# Patient Record
Sex: Female | Born: 1995 | Race: White | Hispanic: Yes | Marital: Married | Smoking: Never smoker
Health system: Southern US, Community
[De-identification: ages and names within clinical notes are randomized; demographics above are authoritative.]

## PROBLEM LIST (undated history)

## (undated) DIAGNOSIS — Z789 Other specified health status: Secondary | ICD-10-CM

## (undated) DIAGNOSIS — K219 Gastro-esophageal reflux disease without esophagitis: Secondary | ICD-10-CM

## (undated) HISTORY — DX: Other specified health status: Z78.9

## (undated) HISTORY — PX: NO PAST SURGERIES: SHX2092

## (undated) HISTORY — PX: WISDOM TOOTH EXTRACTION: SHX21

## (undated) HISTORY — DX: Gastro-esophageal reflux disease without esophagitis: K21.9

---

## 2021-06-08 ENCOUNTER — Ambulatory Visit (INDEPENDENT_AMBULATORY_CARE_PROVIDER_SITE_OTHER): Payer: Medicaid Other

## 2021-06-08 ENCOUNTER — Other Ambulatory Visit (HOSPITAL_COMMUNITY)
Admission: RE | Admit: 2021-06-08 | Discharge: 2021-06-08 | Disposition: A | Discharge status: Home / Self Care | Payer: Medicaid Other | Source: Ambulatory Visit | Attending: Family Medicine | Admitting: Family Medicine

## 2021-06-08 VITALS — BP 113/62 | HR 60 | Wt 140.5 lb

## 2021-06-08 DIAGNOSIS — Z3401 Encounter for supervision of normal first pregnancy, first trimester: Secondary | ICD-10-CM | POA: Diagnosis present

## 2021-06-08 DIAGNOSIS — Z3400 Encounter for supervision of normal first pregnancy, unspecified trimester: Secondary | ICD-10-CM | POA: Insufficient documentation

## 2021-06-08 MED ORDER — GOJJI WEIGHT SCALE MISC: 1.0000 | 0 refills | Status: DC | PRN

## 2021-06-08 MED ORDER — BLOOD PRESSURE KIT DEVI: 1.0000 | 0 refills | Status: DC | PRN

## 2021-06-08 NOTE — Progress Notes (Signed)
New OB Intake  Patient came in person for her New OB intakes. Eda, spanish interpreter with [facility] assist with the visit.   I discussed the limitations, risks, security and privacy concerns of performing an evaluation and management service by telephone and the availability of in person appointments. I also discussed with the patient that there may be a patient responsible charge related to this service. The patient expressed understanding and agreed to proceed.  I explained I am completing New OB Intake today. We discussed her EDD of 11/28/2021 that is based on LMP of 01/30. Pt is G1/P0. I reviewed her allergies, medications, Medical/Surgical/OB history, and appropriate screenings. I informed her of [facility] services. Based on history, this is a/an  pregnancy uncomplicated .   Patient Active Problem List   Diagnosis Date Noted   Supervision of low-risk first pregnancy 06/08/2021    Delivery Plans:  Plans to deliver at [facility].   MyChart/Babyscripts MyChart access verified. I explained pt will have some visits in office and some virtually. Babyscripts instructions given and order placed. Patient verifies receipt of registration text/e-mail. Account successfully created and app downloaded.  Blood Pressure Cuff  Blood pressure cuff ordered for patient to pick-up from [employer]. Explained after first prenatal appt pt will check weekly and document in Babyscripts.  Weight scale: Patient does not  have weight scale. Weight scale ordered for patient to pick up from [employer].   Anatomy US Explained first scheduled [place] will be around 19 weeks. Anatomy US scheduled for 07/06/2021  at 1:30PM. Pt notified to arrive at 1:15pm.   Labs -Initial OB labs, genetic screening, AFP, OB culture, GC/CH, A1C labs were drawn.   Covid Vaccine Patient has not covid vaccine.   Is patient a CenteringPregnancy candidate?  Declined Declined due to Support Person Concern   Is patient a Mom+Baby  Combined Care candidate?  Declined    Is patient interested in [place]?  No   Informed patient of [facility] Healthy [facility] website  and placed link in her AVS.   Social Determinants of Health Food Insecurity: Patient denies food insecurity. WIC Referral: Patient is interested in [facility] to [facility].  Transportation: Patient denies transportation needs. Childcare: Discussed no children allowed at ultrasound appointments. Offered childcare services; patient declines childcare services at this time.  Send link to Pregnancy Navigators   Placed OB Box on problem list and updated  First visit review I reviewed new OB appt with pt. I explained she will have a pelvic exam and PAP smear. Explained pt will be seen by Warner Mccreedy MD at first visit; encounter routed to appropriate provider. Explained that patient will be seen by pregnancy navigator following visit with provider. [facility] information placed in AVS.   Vidal Schwalbe, CMA 06/08/2021  1:55 PM

## 2021-06-09 ENCOUNTER — Other Ambulatory Visit: Payer: Self-pay | Admitting: Family Medicine

## 2021-06-09 LAB — GC/CHLAMYDIA PROBE AMP (~~LOC~~) NOT AT ARMC
Chlamydia: POSITIVE — AB
Comment: NEGATIVE
Comment: NORMAL
Neisseria Gonorrhea: NEGATIVE

## 2021-06-09 MED ORDER — AZITHROMYCIN 500 MG PO TABS: 1000.0000 mg | ORAL_TABLET | Freq: Once | ORAL | 0 refills | Status: AC

## 2021-06-10 ENCOUNTER — Telehealth: Payer: Self-pay | Admitting: *Deleted

## 2021-06-10 ENCOUNTER — Encounter: Payer: Self-pay | Admitting: *Deleted

## 2021-06-10 LAB — URINE CULTURE, OB REFLEX: Organism ID, Bacteria: NO GROWTH

## 2021-06-10 LAB — CULTURE, OB URINE

## 2021-06-10 NOTE — Telephone Encounter (Signed)
I called Roberta Hodges with Interpreter Lorinda Creed and notified her of + Chlamydia and RX sent to [facility] for [employer]. Also notified her partner should get treated by his PCP or [facility]. Also advised no sexual intercourse/ contact for 2 weeks after both treated. Also will be retested in a few weeks. She voices understanding. STD report to [facility] completed and faxed. Nancy Fetter

## 2021-06-10 NOTE — Telephone Encounter (Signed)
-----   Message from Warner Mccreedy, MD sent at 06/09/2021  9:57 PM EDT ----- Regarding: Positive chlamydia, request to reach out to patient Hi This patient's swab came back positive for chlamydia. Could someone reach out to her to let her know and discuss partner testing and treatment as well?  I sent her treatment already to her pharmacy ([facility])  Thanks Dr. Ephriam Jenkins

## 2021-06-11 LAB — CBC/D/PLT+RPR+RH+ABO+RUBIGG...
Antibody Screen: NEGATIVE
Basophils Absolute: 0 10*3/uL (ref 0.0–0.2)
Basos: 0 %
EOS (ABSOLUTE): 0 10*3/uL (ref 0.0–0.4)
Eos: 0 %
HCV Ab: REACTIVE — AB
HIV Screen 4th Generation wRfx: NONREACTIVE
Hematocrit: 35.1 % (ref 34.0–46.6)
Hemoglobin: 12 g/dL (ref 11.1–15.9)
Hepatitis B Surface Ag: NEGATIVE
Immature Grans (Abs): 0 10*3/uL (ref 0.0–0.1)
Immature Granulocytes: 1 %
Lymphocytes Absolute: 1.6 10*3/uL (ref 0.7–3.1)
Lymphs: 19 %
MCH: 30.3 pg (ref 26.6–33.0)
MCHC: 34.2 g/dL (ref 31.5–35.7)
MCV: 89 fL (ref 79–97)
Monocytes Absolute: 0.6 10*3/uL (ref 0.1–0.9)
Monocytes: 7 %
Neutrophils Absolute: 6.2 10*3/uL (ref 1.4–7.0)
Neutrophils: 73 %
Platelets: 284 10*3/uL (ref 150–450)
RBC: 3.96 x10E6/uL (ref 3.77–5.28)
RDW: 12.9 % (ref 11.7–15.4)
RPR Ser Ql: NONREACTIVE
Rh Factor: POSITIVE
Rubella Antibodies, IGG: 3.69 index (ref >0.99)
WBC: 8.5 10*3/uL (ref 3.4–10.8)

## 2021-06-11 LAB — AFP, SERUM, OPEN SPINA BIFIDA
AFP MoM: 0.75
AFP Value: 24.7 ng/mL
Gest. Age on Collection Date: 15.3 weeks
Maternal Age At EDD: 27.4 yr
OSBR Risk 1 IN: 10000
Test Results:: NEGATIVE
Weight: 140 [lb_av]

## 2021-06-11 LAB — HCV RT-PCR, QUANT (NON-GRAPH): Hepatitis C Quantitation: NOT DETECTED IU/mL

## 2021-06-11 LAB — HEMOGLOBIN A1C
Est. average glucose Bld gHb Est-mCnc: 103 mg/dL
Hgb A1c MFr Bld: 5.2 % (ref 4.8–5.6)

## 2021-06-16 ENCOUNTER — Telehealth: Payer: Self-pay

## 2021-06-16 ENCOUNTER — Other Ambulatory Visit: Payer: Self-pay | Admitting: Family Medicine

## 2021-06-16 DIAGNOSIS — R768 Other specified abnormal immunological findings in serum: Secondary | ICD-10-CM

## 2021-06-16 DIAGNOSIS — Z3401 Encounter for supervision of normal first pregnancy, first trimester: Secondary | ICD-10-CM

## 2021-06-16 NOTE — Progress Notes (Signed)
Initial OB labs positive Hep C ab.. Plan for LFTs and hep C Quant testing and will further discuss diagnosis and post partum treatment at initial OB visit in 06/2021  Message sent to clinical pool to inform patient of results and plan.  Warner Mccreedy, MD, MPH OB Fellow, Faculty Practice

## 2021-06-16 NOTE — Addendum Note (Signed)
Addended by: Faythe Casa on: 06/16/2021 10:02 PM   Modules accepted: Orders

## 2021-06-16 NOTE — Telephone Encounter (Signed)
Call placed to pt with interpreter Eda. Spoke with pt. Pt given results and recommendations per Dr Ephriam Jenkins. Pt verbalized understanding. Pt scheduled for lab draw on 5/26 at 1030am. Pt agreeable to date and time of appt.  Roberta Hodges

## 2021-06-16 NOTE — Telephone Encounter (Signed)
-----   Message from Warner Mccreedy, MD sent at 06/16/2021  9:46 AM EDT ----- Regarding: Request to inform patient of hepatitis C testing results and plan Hi This patient's initial OB labs was positive for Hepatitis C. Could you call and inform patient of this (she is Spanish speaking per her chart). She should come in and get some additional labs done which I have ordered (these will check her liver function and also the amount of virus in her body). This will allow [place] to better decide on next steps although in general people don't get treated for hepatitis C in pregnancy but can be treated after.  I can discuss more with her when she comes for her new OB appt with me in June. If she can get her labs done before that it would be helpful so I can discuss those results with her too.  Thanks Dr. Ephriam Jenkins

## 2021-06-17 ENCOUNTER — Other Ambulatory Visit: Payer: Medicaid Other

## 2021-06-17 DIAGNOSIS — Z3402 Encounter for supervision of normal first pregnancy, second trimester: Secondary | ICD-10-CM

## 2021-06-18 LAB — COMPREHENSIVE METABOLIC PANEL
ALT: 9 IU/L (ref 0–32)
AST: 16 IU/L (ref 0–40)
Albumin/Globulin Ratio: 1.4 (ref 1.2–2.2)
Albumin: 4.2 g/dL (ref 3.9–5.0)
Alkaline Phosphatase: 51 IU/L (ref 44–121)
BUN/Creatinine Ratio: 12 (ref 9–23)
BUN: 6 mg/dL (ref 6–20)
Bilirubin Total: 0.2 mg/dL (ref 0.0–1.2)
CO2: 21 mmol/L (ref 20–29)
Calcium: 9.4 mg/dL (ref 8.7–10.2)
Chloride: 100 mmol/L (ref 96–106)
Creatinine, Ser: 0.51 mg/dL — ABNORMAL LOW (ref 0.57–1.00)
Globulin, Total: 2.9 g/dL (ref 1.5–4.5)
Glucose: 80 mg/dL (ref 70–99)
Potassium: 4.4 mmol/L (ref 3.5–5.2)
Sodium: 136 mmol/L (ref 134–144)
Total Protein: 7.1 g/dL (ref 6.0–8.5)
eGFR: 132 mL/min/{1.73_m2} (ref >59)

## 2021-06-23 ENCOUNTER — Encounter: Payer: Self-pay | Admitting: *Deleted

## 2021-07-06 ENCOUNTER — Ambulatory Visit: Payer: Medicaid Other | Admitting: *Deleted

## 2021-07-06 ENCOUNTER — Ambulatory Visit: Payer: Medicaid Other | Attending: Family Medicine

## 2021-07-06 ENCOUNTER — Other Ambulatory Visit: Payer: Self-pay | Admitting: *Deleted

## 2021-07-06 VITALS — BP 117/63 | HR 93 | Ht 59.06 in

## 2021-07-06 DIAGNOSIS — Z3402 Encounter for supervision of normal first pregnancy, second trimester: Secondary | ICD-10-CM

## 2021-07-06 DIAGNOSIS — Z363 Encounter for antenatal screening for malformations: Secondary | ICD-10-CM | POA: Diagnosis not present

## 2021-07-06 DIAGNOSIS — O98512 Other viral diseases complicating pregnancy, second trimester: Secondary | ICD-10-CM | POA: Insufficient documentation

## 2021-07-06 DIAGNOSIS — Z362 Encounter for other antenatal screening follow-up: Secondary | ICD-10-CM

## 2021-07-06 DIAGNOSIS — B1920 Unspecified viral hepatitis C without hepatic coma: Secondary | ICD-10-CM

## 2021-07-06 DIAGNOSIS — Z3401 Encounter for supervision of normal first pregnancy, first trimester: Secondary | ICD-10-CM | POA: Insufficient documentation

## 2021-07-06 DIAGNOSIS — Z3A19 19 weeks gestation of pregnancy: Secondary | ICD-10-CM | POA: Diagnosis not present

## 2021-07-14 ENCOUNTER — Other Ambulatory Visit (HOSPITAL_COMMUNITY)
Admission: RE | Admit: 2021-07-14 | Discharge: 2021-07-14 | Disposition: A | Discharge status: Home / Self Care | Payer: Medicaid Other | Source: Ambulatory Visit | Attending: Family Medicine | Admitting: Family Medicine

## 2021-07-14 ENCOUNTER — Other Ambulatory Visit: Payer: Self-pay

## 2021-07-14 ENCOUNTER — Ambulatory Visit (INDEPENDENT_AMBULATORY_CARE_PROVIDER_SITE_OTHER): Payer: Medicaid Other | Admitting: Family Medicine

## 2021-07-14 ENCOUNTER — Encounter: Payer: Self-pay | Admitting: Family Medicine

## 2021-07-14 VITALS — BP 114/69 | HR 93 | Wt 151.0 lb

## 2021-07-14 DIAGNOSIS — Z8619 Personal history of other infectious and parasitic diseases: Secondary | ICD-10-CM | POA: Diagnosis not present

## 2021-07-14 DIAGNOSIS — Z3A20 20 weeks gestation of pregnancy: Secondary | ICD-10-CM

## 2021-07-14 DIAGNOSIS — Z3402 Encounter for supervision of normal first pregnancy, second trimester: Secondary | ICD-10-CM

## 2021-07-14 DIAGNOSIS — R768 Other specified abnormal immunological findings in serum: Secondary | ICD-10-CM | POA: Insufficient documentation

## 2021-07-15 LAB — CERVICOVAGINAL ANCILLARY ONLY
Chlamydia: NEGATIVE
Comment: NEGATIVE
Comment: NEGATIVE
Comment: NORMAL
Neisseria Gonorrhea: NEGATIVE
Trichomonas: NEGATIVE

## 2021-07-15 LAB — CYTOLOGY - PAP
Comment: NEGATIVE
Diagnosis: NEGATIVE
High risk HPV: NEGATIVE

## 2021-08-05 ENCOUNTER — Ambulatory Visit: Payer: Medicaid Other | Admitting: *Deleted

## 2021-08-05 ENCOUNTER — Ambulatory Visit: Payer: Medicaid Other | Attending: Maternal & Fetal Medicine

## 2021-08-05 VITALS — BP 100/60 | HR 89

## 2021-08-05 DIAGNOSIS — O98412 Viral hepatitis complicating pregnancy, second trimester: Secondary | ICD-10-CM | POA: Insufficient documentation

## 2021-08-05 DIAGNOSIS — Z363 Encounter for antenatal screening for malformations: Secondary | ICD-10-CM | POA: Diagnosis present

## 2021-08-05 DIAGNOSIS — Z3A23 23 weeks gestation of pregnancy: Secondary | ICD-10-CM | POA: Diagnosis not present

## 2021-08-05 DIAGNOSIS — O358XX0 Maternal care for other (suspected) fetal abnormality and damage, not applicable or unspecified: Secondary | ICD-10-CM

## 2021-08-05 DIAGNOSIS — B182 Chronic viral hepatitis C: Secondary | ICD-10-CM | POA: Diagnosis not present

## 2021-08-05 DIAGNOSIS — Z362 Encounter for other antenatal screening follow-up: Secondary | ICD-10-CM

## 2021-08-05 DIAGNOSIS — B1920 Unspecified viral hepatitis C without hepatic coma: Secondary | ICD-10-CM

## 2021-08-05 DIAGNOSIS — Z3402 Encounter for supervision of normal first pregnancy, second trimester: Secondary | ICD-10-CM

## 2021-08-11 ENCOUNTER — Encounter: Payer: Self-pay | Admitting: Obstetrics and Gynecology

## 2021-08-11 ENCOUNTER — Other Ambulatory Visit: Payer: Self-pay

## 2021-08-11 ENCOUNTER — Ambulatory Visit (INDEPENDENT_AMBULATORY_CARE_PROVIDER_SITE_OTHER): Payer: Medicaid Other | Admitting: Obstetrics and Gynecology

## 2021-08-11 VITALS — BP 111/64 | HR 92 | Wt 155.9 lb

## 2021-08-11 DIAGNOSIS — Z3A24 24 weeks gestation of pregnancy: Secondary | ICD-10-CM

## 2021-08-11 DIAGNOSIS — Z3402 Encounter for supervision of normal first pregnancy, second trimester: Secondary | ICD-10-CM

## 2021-08-11 DIAGNOSIS — O99619 Diseases of the digestive system complicating pregnancy, unspecified trimester: Secondary | ICD-10-CM

## 2021-08-11 DIAGNOSIS — K219 Gastro-esophageal reflux disease without esophagitis: Secondary | ICD-10-CM

## 2021-08-11 DIAGNOSIS — R768 Other specified abnormal immunological findings in serum: Secondary | ICD-10-CM

## 2021-08-11 MED ORDER — FAMOTIDINE 20 MG PO TABS: 20.0000 mg | ORAL_TABLET | Freq: Every evening | ORAL | 0 refills | Status: DC

## 2021-08-11 NOTE — Progress Notes (Signed)
   PRENATAL VISIT NOTE  Subjective:  Roberta Hodges is a 26 y.o. G1P0 at [redacted]w[redacted]d being seen today for ongoing prenatal care.  She is currently monitored for the following issues for this low-risk pregnancy and has Supervision of low-risk first pregnancy and Hepatitis C antibody test positive on their problem list.  Patient reports heartburn.  Contractions: Not present. Vag. Bleeding: None.  Movement: Present. Starting to feel quickening. Denies leaking of fluid.   The following portions of the patient's history were reviewed and updated as appropriate: allergies, current medications, past family history, past medical history, past social history, past surgical history and problem list.   Objective:   Vitals:   08/11/21 1119  BP: 111/64  Pulse: 92  Weight: 155 lb 14.4 oz (70.7 kg)    Fetal Status: Fetal Heart Rate (bpm): 151 Fundal Height: 22 cm Movement: Present     General:  Alert, oriented and cooperative. Patient is in no acute distress.  Skin: Skin is warm and dry. No rash noted.   Cardiovascular: Normal heart rate noted  Respiratory: Normal respiratory effort, no problems with respiration noted  Abdomen: Soft, gravid, appropriate for gestational age.  Pain/Pressure: Absent     Pelvic: Cervical exam deferred        Extremities: Normal range of motion.  Edema: None  Mental Status: Normal mood and affect. Normal behavior. Normal judgment and thought content.   Assessment and Plan:  Pregnancy: G1P0 at [redacted]w[redacted]d 1. [redacted] weeks gestation of pregnancy 2. Encounter for supervision of low-risk first pregnancy in second trimester Doing well, no concerns. Reviewed anatomy scan. Shows normal growth and development. - Scheduled for 2hr GTT next visit.  3. Hepatitis C antibody test positive Postive HCV Ab, with negative titers, indicating likely previous infection was noted on initial prenatal labs. Plan to recheck HCV Ab with 28 week labs. - Hepatitis C Antibody; Future  4.  Gastroesophageal reflux in pregnancy - famotidine (PEPCID) 20 MG tablet; Take 1 tablet (20 mg total) by mouth at bedtime.  Dispense: 60 tablet; Refill: 0  Preterm labor symptoms and general obstetric precautions including but not limited to vaginal bleeding, contractions, leaking of fluid and fetal movement were reviewed in detail with the patient. Please refer to After Visit Summary for other counseling recommendations.   Return in about 4 weeks (around 09/08/2021) for 2 hour GTT and third trimester labs, lob.  Future Appointments  Date Time Provider Department Center  09/08/2021  8:35 AM [facility]  09/08/2021  9:30 AM [facility]  09/22/2021 10:55 AM Marylene Land, CNM [facility]  10/06/2021 10:15 AM Marylene Land, CNM [facility]  10/20/2021 10:15 AM Hermina Staggers, MD [facility]    [place] Benita Gutter, MD MPH OB Fellow, Faculty Practice.  Patient seen with Dr Donavan Foil.

## 2021-09-07 ENCOUNTER — Other Ambulatory Visit: Payer: Self-pay

## 2021-09-07 DIAGNOSIS — Z3403 Encounter for supervision of normal first pregnancy, third trimester: Secondary | ICD-10-CM

## 2021-09-08 ENCOUNTER — Other Ambulatory Visit: Payer: Self-pay

## 2021-09-08 ENCOUNTER — Ambulatory Visit (INDEPENDENT_AMBULATORY_CARE_PROVIDER_SITE_OTHER): Payer: Medicaid Other

## 2021-09-08 ENCOUNTER — Other Ambulatory Visit: Payer: Medicaid Other

## 2021-09-08 VITALS — BP 113/68 | HR 81 | Wt 165.0 lb

## 2021-09-08 DIAGNOSIS — Z23 Encounter for immunization: Secondary | ICD-10-CM

## 2021-09-08 DIAGNOSIS — R768 Other specified abnormal immunological findings in serum: Secondary | ICD-10-CM

## 2021-09-08 DIAGNOSIS — Z3A28 28 weeks gestation of pregnancy: Secondary | ICD-10-CM

## 2021-09-08 DIAGNOSIS — Z3403 Encounter for supervision of normal first pregnancy, third trimester: Secondary | ICD-10-CM

## 2021-09-08 NOTE — Progress Notes (Signed)
Patient complains of lower back along with a "pulluing" sensation on mid abdomen. Patient would like to discuss possible Hep C Dx

## 2021-09-08 NOTE — Progress Notes (Signed)
   PRENATAL VISIT NOTE  Subjective:  Roberta Hodges is a 26 y.o. G1P0 at [redacted]w[redacted]d being seen today for ongoing prenatal care.  She is currently monitored for the following issues for this low-risk pregnancy and has Supervision of low-risk first pregnancy and Hepatitis C antibody test positive on their problem list.  Patient reports no complaints.  Contractions: Not present. Vag. Bleeding: None.  Movement: Present. Denies leaking of fluid.   The following portions of the patient's history were reviewed and updated as appropriate: allergies, current medications, past family history, past medical history, past social history, past surgical history and problem list.   Objective:   Vitals:   09/08/21 0847  BP: 113/68  Pulse: 81  Weight: 165 lb (74.8 kg)    Fetal Status: Fetal Heart Rate (bpm): 150 Fundal Height: 27 cm Movement: Present     General:  Alert, oriented and cooperative. Patient is in no acute distress.  Skin: Skin is warm and dry. No rash noted.   Cardiovascular: Normal heart rate noted  Respiratory: Normal respiratory effort, no problems with respiration noted  Abdomen: Soft, gravid, appropriate for gestational age.  Pain/Pressure: Present     Pelvic: Cervical exam deferred        Extremities: Normal range of motion.     Mental Status: Normal mood and affect. Normal behavior. Normal judgment and thought content.   Assessment and Plan:  Pregnancy: G1P0 at [redacted]w[redacted]d 1. Encounter for supervision of low-risk first pregnancy in third trimester - Routine OB. Doing well, no concerns - GTT and labs today - Anticipatory guidance for upcoming appointments provided - Spanish interpretor used for entirety of today's visit  - Tdap vaccine greater than or equal to 7yo IM  2. [redacted] weeks gestation of pregnancy - FH appropriate - Endorses active fetal movement  3. Hepatitis C antibody test positive - Repeat antibody today   Preterm labor symptoms and general obstetric precautions  including but not limited to vaginal bleeding, contractions, leaking of fluid and fetal movement were reviewed in detail with the patient. Please refer to After Visit Summary for other counseling recommendations.   Return in about 2 weeks (around 09/22/2021).  Future Appointments  Date Time Provider Department Center  09/22/2021 10:55 AM Marylene Land, CNM [facility]  10/06/2021 10:15 AM Marylene Land, CNM [facility]  10/20/2021 10:15 AM Hermina Staggers, MD [facility]    Brand Males, CNM

## 2021-09-09 LAB — RPR: RPR Ser Ql: NONREACTIVE

## 2021-09-09 LAB — CBC
Hematocrit: 31.2 % — ABNORMAL LOW (ref 34.0–46.6)
Hemoglobin: 10.6 g/dL — ABNORMAL LOW (ref 11.1–15.9)
MCH: 30.5 pg (ref 26.6–33.0)
MCHC: 34 g/dL (ref 31.5–35.7)
MCV: 90 fL (ref 79–97)
Platelets: 272 10*3/uL (ref 150–450)
RBC: 3.47 x10E6/uL — ABNORMAL LOW (ref 3.77–5.28)
RDW: 12.2 % (ref 11.7–15.4)
WBC: 9.2 10*3/uL (ref 3.4–10.8)

## 2021-09-09 LAB — GLUCOSE TOLERANCE, 2 HOURS W/ 1HR
Glucose, 1 hour: 85 mg/dL (ref 70–179)
Glucose, 2 hour: 64 mg/dL — ABNORMAL LOW (ref 70–152)
Glucose, Fasting: 79 mg/dL (ref 70–91)

## 2021-09-09 LAB — HIV ANTIBODY (ROUTINE TESTING W REFLEX): HIV Screen 4th Generation wRfx: NONREACTIVE

## 2021-09-09 LAB — HEPATITIS C ANTIBODY: Hep C Virus Ab: NONREACTIVE

## 2021-09-21 NOTE — Progress Notes (Unsigned)
   PRENATAL VISIT NOTE  Subjective:  Roberta Hodges is a 26 y.o. G1P0 at [redacted]w[redacted]d being seen today for ongoing prenatal care.  She is currently monitored for the following issues for this {Blank single:19197::"high-risk","low-risk"} pregnancy and has Supervision of low-risk first pregnancy and Hepatitis C antibody test positive on their problem list.  Patient reports {sx:14538}.   .  .   . Denies leaking of fluid.   The following portions of the patient's history were reviewed and updated as appropriate: allergies, current medications, past family history, past medical history, past social history, past surgical history and problem list.   Objective:  There were no vitals filed for this visit.  Fetal Status:           General:  Alert, oriented and cooperative. Patient is in no acute distress.  Skin: Skin is warm and dry. No rash noted.   Cardiovascular: Normal heart rate noted  Respiratory: Normal respiratory effort, no problems with respiration noted  Abdomen: Soft, gravid, appropriate for gestational age.        Pelvic: {Blank single:19197::"Cervical exam performed in the presence of a chaperone","Cervical exam deferred"}        Extremities: Normal range of motion.     Mental Status: Normal mood and affect. Normal behavior. Normal judgment and thought content.   Assessment and Plan:  Pregnancy: G1P0 at [redacted]w[redacted]d There are no diagnoses linked to this encounter. {Blank single:19197::"Term","Preterm"} labor symptoms and general obstetric precautions including but not limited to vaginal bleeding, contractions, leaking of fluid and fetal movement were reviewed in detail with the patient. Please refer to After Visit Summary for other counseling recommendations.   No follow-ups on file.  Future Appointments  Date Time Provider Department Center  09/22/2021 10:55 AM Marylene Land, CNM [facility]  10/06/2021 10:15 AM Marylene Land, CNM [facility]  10/20/2021  10:15 AM Hermina Staggers, MD [facility]    Marylene Land, CNM

## 2021-09-22 ENCOUNTER — Ambulatory Visit (INDEPENDENT_AMBULATORY_CARE_PROVIDER_SITE_OTHER): Payer: Medicaid Other | Admitting: Student

## 2021-09-22 ENCOUNTER — Other Ambulatory Visit: Payer: Self-pay

## 2021-09-22 VITALS — BP 115/76 | HR 92 | Wt 169.4 lb

## 2021-09-22 DIAGNOSIS — Z3A30 30 weeks gestation of pregnancy: Secondary | ICD-10-CM

## 2021-09-22 DIAGNOSIS — Z3403 Encounter for supervision of normal first pregnancy, third trimester: Secondary | ICD-10-CM

## 2021-09-22 NOTE — Patient Instructions (Addendum)
AREA PEDIATRIC/FAMILY PRACTICE PHYSICIANS  [place] ([id number]) [facility] Deirdre Priest, MD; Lum Babe, MD; Sheffield Slider, MD; Leveda Anna, MD; McDiarmid, MD; Jerene Bears, MD; Jennette Kettle, MD; Gwendolyn Grant, MD [address] [place] [place], [place] [address] [phone] Mon-Fri 8:30-12:30, 1:30-5:00 Providers come to see babies at [facility] Accepting [facility] at [facility] providers who accept newborns: Docia Chuck, MD; Kateri Plummer, MD; Paulino Rily, MD [address], [place], [place] [address] [phone] Mon-Fri 8:00-5:30 Babies seen by providers at [facility] Does NOT accept Medicaid Please call early in hospitalization for appointment (limited availability)  [facility], MD [address] [place], [place] [address] [phone] Mon, Tue, Thur, Fri 8:30-5:00, Wed 10:00-7:00 (closed 1-2pm) Babies seen by [facility] providers Accepting Medicaid Donnie Coffin - Pediatrician Donnie Coffin, MD [address], [place], [place] [address] [phone] Mon-Fri 8:30-5:00, Sat 8:30-12:00 Provider comes to see babies at [facility] Accepting Medicaid Must have been referred from current patients or contacted office prior to delivery [facility]) Manson Passey, MD; Ave Filter, MD; Luna Fuse, MD; Kennedy Bucker, MD; Konrad Dolores, MD; Kathlene November, MD; Jenne Campus, MD; Lubertha South, MD; Wynetta Emery, MD; Duffy Rhody, MD; Gerre Couch, NP; Shirl Harris, NP [address] [place], [place], [place] [address] [phone] Farris Has, Thur, Fri 8:30-5:30, Wed 9:30-5:30, Sat 8:30-12:30 Babies seen by [facility] providers Accepting Medicaid Only accepting infants of first-time parents or siblings of current patients Hospital discharge coordinator will make follow-up appointment [place] [phone]) [facility] Maudry Mayhew, MD; Alita Chyle, MD; Princella Ion, MD;  MD; Earlene Plater, MD; Jamesetta Orleans, MD; Alvera Novel, MD; Clarene Duke, MD; Rana Snare, MD; Carmon Ginsberg, MD; Alinda Money, MD; Hosie Poisson, MD; Mayford Knife, MD [address] [place], [place] [address] [phone] Mon-Fri 8:30-5:00 (extended evenings Mon-Thur as needed), Sat-Sun 10:00-1:00 Providers come to see babies at [facility] Accepting Medicaid for families of first-time babies and families with all children in the household age 63 and under. Must register with office prior to making appointment (M-F only).  [facility] at [facility])  Holly Bodily, MD; Zachery Dauer, MD; Stefan Church, MD; Sabino Dick, MD; Quitman Livings, MD; Farris Has, MD; Gaynell Face, MD; Betha Loa, MD; Colon Flattery, MD; Clifton James, MD [address] [place] [place], [place] [address] [phone] Mon-Fri 8:30-5:30, Sat (Oct.-Mar.) 9:00-1:00 Babies seen by providers at [facility] Accepting [facility]   [place] [phone] & [phone]) [facility] [place], MD; [place], [place]; [place], [place] [address] [facility] [address], [place], [place] [address] [phone] Mon-Fri 8:00-5:00 Babies seen by [facility] providers Accepting [facility] - Dorann Lodge [place], MD; [place], [place]; Ward, NP; [place], [place] [address] [place], [place], [place] [address] [phone] Mon-Fri 8:00-5:00 Babies seen by providers at [facility] Accepting [facility] [place] [phone]) Jeanelle Malling, [place]; Eddie Candle, MD; [place], MD; [place], [place]; Constance Goltz, MD; [place], [place] [address] [facility] [address], [place], [place] [address] [phone] Mon-Fri 8:30-5:00, Sat 9:00-12:00 Babies seen by providers at [facility] Accepting Medicaid Please register online then schedule online or call office [email or web address] - [facility] [place] [facility] at [facility]) Durene Cal, NP; Lucianne Muss, MD; [place], [place] [address], [place], [place]  [address] [phone] Mon-Fri 8:00-5:00 Babies seen by providers at [facility] Accepting [facility] ([facility]) [place], MD; Reed Breech, NP; Shelva Majestic, MD [address], [place], [place] [address] [phone] Mon-Fri 8:00-5:30, Sat&Sun by appointment (phones open at 8:30)  Babies seen by [facility] providers Accepting Medicaid Must be a first-time baby or sibling of current patient  [place] ([phone] & [phone]) [facility] Medicine [place], [place]; [place], [place]; Dimple Casey, MD; [place], [place]; Carolyne Fiscal, MD [address], [place], [place] [address] [phone] Mon-Thur 8:00-7:00, Fri 8:00-5:00, Sat 8:00-12:00, Sun 9:00-12:00 Babies seen by [facility] providers Accepting Medicaid [facility] Medicine at Liana Gerold, MD; Gaynell Face, MD; [facility], MD [address], [place], [place] [address] [phone] Mon-Thur 8:00-5:00 Babies seen by providers at [facility] Accepting Medicaid [facility] Family Medicine at Dorthey Sawyer, MD; [place], MD; Madilyn Fireman, MD; Melvyn Neth, MD; List, MD; Lazarus Salines, MD; Gaynell Face, MD; Berneda Rose, MD; Flora Lipps, MD; Beryl Meager, MD; Luther Redo, MD; Lavonia Drafts, MD; Kellie Simmering, MD [address] [place] [place], [place] [address] [phone] Mon-Fri 8:00-5:30, Sat (Oct.-Mar.) 9:00-1:00 Babies seen by providers at [facility] Accepting Medicaid Must fill out new patient packet, available online at [email or web address] Pediatrics - [facility] at [facility]) Spero Geralds, NP; Tiburcio Pea, NP; Tresa Endo, NP; Whitney Post, MD; [place], [place]; Hennie Duos, MD; Wynne Dust, MD; Kavin Leech, NP [address], [place], [place] [address] [phone] Mon-Thur 8:00-5:30, Fri 8:00-5:00 Babies seen by providers at [facility] Accepting Medicaid  [place] [facility]  Lyndel Safe, MD, [place], [place], [place], [place] [address] [place], [place] [address] [phone] [facility]  [address] [place], [place] [address] [phone] [address] [place], [place] [address] [phone] [facility] Office)  [facility] [address], [place], [place] [address] [phone] [facility] [address] [place], [place], [place] [address] [phone] [facility] [address], [place], [place] [address] [phone] [facility] [address] [place], [place] [address] [phone] [facility] [address], [place], [place] [address] [phone] [facility] [address] [place], [place] [address] [phone] [facility]  [address] [place], [place] [address] [phone] Dr. Belia Heman. [place] [address] [place], [place] [address] [phone] [facility] [address], [place], [place] [address] [phone] [facility] [address] [place], [place] [address] [phone]

## 2021-10-03 NOTE — Progress Notes (Signed)
   PRENATAL VISIT NOTE  Subjective:  Roberta Hodges is a 26 y.o. G1P0 at [redacted]w[redacted]d being seen today for ongoing prenatal care.  She is currently monitored for the following issues for this low-risk pregnancy and has Supervision of low-risk first pregnancy and Hepatitis C antibody test positive on their problem list.  Patient reports no complaints.  Contractions: Not present. Vag. Bleeding: None.  Movement: Present. Denies leaking of fluid.   The following portions of the patient's history were reviewed and updated as appropriate: allergies, current medications, past family history, past medical history, past social history, past surgical history and problem list.   Objective:   Vitals:   10/06/21 1036  BP: 114/60  Pulse: 89  Weight: 171 lb 1.6 oz (77.6 kg)    Fetal Status: Fetal Heart Rate (bpm): 160 Fundal Height: 32 cm Movement: Present     General:  Alert, oriented and cooperative. Patient is in no acute distress.  Skin: Skin is warm and dry. No rash noted.   Cardiovascular: Normal heart rate noted  Respiratory: Normal respiratory effort, no problems with respiration noted  Abdomen: Soft, gravid, appropriate for gestational age.  Pain/Pressure: Present     Pelvic: Cervical exam deferred        Extremities: Normal range of motion.  Edema: None  Mental Status: Normal mood and affect. Normal behavior. Normal judgment and thought content.   Assessment and Plan:  Pregnancy: G1P0 at [redacted]w[redacted]d 1. [redacted] weeks gestation of pregnancy   2. Encounter for supervision of low-risk first pregnancy in third trimester    -patient doing well, no complaints -reviewed list of pediatricians again -undecided about contraception, she will continue to think about it   Preterm labor symptoms and general obstetric precautions including but not limited to vaginal bleeding, contractions, leaking of fluid and fetal movement were reviewed in detail with the patient. Please refer to After Visit Summary for  other counseling recommendations.   Return in about 2 weeks (around 10/20/2021).  Future Appointments  Date Time Provider Department Center  10/20/2021 10:15 AM Hermina Staggers, MD [facility]  11/03/2021 10:15 AM Adam Phenix, MD [facility]  11/10/2021 10:15 AM Dorathy Kinsman, CNM [facility]  11/17/2021 10:35 AM Reva Bores, MD [facility]  11/24/2021 11:15 AM Warden Fillers, MD [facility]  12/01/2021 11:15 AM Reva Bores, MD [facility]    Marylene Land, CNM

## 2021-10-06 ENCOUNTER — Other Ambulatory Visit: Payer: Self-pay

## 2021-10-06 ENCOUNTER — Ambulatory Visit (INDEPENDENT_AMBULATORY_CARE_PROVIDER_SITE_OTHER): Payer: Medicaid Other | Admitting: Student

## 2021-10-06 VITALS — BP 114/60 | HR 89 | Wt 171.1 lb

## 2021-10-06 DIAGNOSIS — Z3403 Encounter for supervision of normal first pregnancy, third trimester: Secondary | ICD-10-CM

## 2021-10-06 DIAGNOSIS — Z3A32 32 weeks gestation of pregnancy: Secondary | ICD-10-CM

## 2021-10-20 ENCOUNTER — Ambulatory Visit (INDEPENDENT_AMBULATORY_CARE_PROVIDER_SITE_OTHER): Payer: Medicaid Other | Admitting: Obstetrics and Gynecology

## 2021-10-20 ENCOUNTER — Encounter: Payer: Self-pay | Admitting: Obstetrics and Gynecology

## 2021-10-20 ENCOUNTER — Other Ambulatory Visit: Payer: Self-pay

## 2021-10-20 VITALS — BP 117/66 | HR 95 | Wt 176.1 lb

## 2021-10-20 DIAGNOSIS — R768 Other specified abnormal immunological findings in serum: Secondary | ICD-10-CM

## 2021-10-20 DIAGNOSIS — Z3403 Encounter for supervision of normal first pregnancy, third trimester: Secondary | ICD-10-CM

## 2021-10-20 DIAGNOSIS — Z3A34 34 weeks gestation of pregnancy: Secondary | ICD-10-CM

## 2021-10-20 NOTE — Patient Instructions (Signed)
Tercer trimestre de embarazo Third Trimester of Pregnancy  El tercer trimestre de embarazo va desde la semana 28 hasta la semana 40. Tambin se dice que va desde el mes 7 hasta el mes 9. En este trimestre, el beb en gestacin (feto) crece muy rpidamente. Hacia el final del noveno mes, el beb en gestacin mide alrededor de 20 pulgadas (45 cm) de largo. Pesa entre 6 y 10 libras (2,70 y 4,50 kg). Cambios en el cuerpo durante el tercer trimestre Su organismo contina atravesando por muchos cambios durante este perodo. Los cambios varan y generalmente vuelven a la normalidad despus del nacimiento del beb. Cambios fsicos Seguir aumentando de peso. Puede ser que aumente entre 25 y 35 libras (11 y 16 kg) hacia el final del embarazo. Si tiene bajo peso, puede aumentar entre 28 y 40 lb (unos 13 a 18 kg). Si tiene sobrepeso, puede aumentar entre 15 y 25 libras (unos 7 a 11 kg). Podrn aparecer las primeras estras en las caderas, el vientre (abdomen) y las mamas. Las mamas seguirn creciendo y pueden doler. Un lquido amarillo (calostro) puede salir de sus pechos. Esta es la primera leche que usted produce para el beb. Tal vez haya cambios en el cabello. El ombligo puede salir hacia afuera. Puede observar que se le hinchan ms las manos, la cara o los tobillos. Cambios en la salud Es posible que tenga acidez estomacal. Es posible que tenga dificultades para defecar (estreimiento). Pueden aparecerle hemorroides. Estas son venas hinchadas en el ano que pueden picar o doler. Puede comenzar a tener venas hinchadas (vrices) en las piernas. Puede presentar ms dolor en la pelvis, la espalda o los muslos. Puede presentar ms hormigueo o entumecimiento en las manos, los brazos y las piernas. La piel de su vientre tambin puede sentirse entumecida. Es posible que sienta falta de aire a medida que el tero se agranda. Otros cambios Es posible que haga pis (orine) con mayor frecuencia. Puede tener ms  problemas para dormir. Puede notar que el beb en gestacin "baja" o se mueve ms hacia bajo, en el vientre. Puede notar ms secrecin proveniente de la vagina. Puede sentir las articulaciones flojas y puede sentir dolor alrededor del hueso plvico. Siga estas instrucciones en su casa: Medicamentos Use los medicamentos de venta libre y los recetados solamente como se lo haya indicado el mdico. Algunos medicamentos no son seguros durante el embarazo. Tome vitaminas prenatales que contengan por lo menos 600 microgramos (mcg) de cido flico. Comida y bebida Consuma comidas saludables que incluyan lo siguiente: Frutas y verduras frescas. Cereales integrales. Buenas fuentes de protenas, como carne, huevos y tofu. Productos lcteos con bajo contenido de grasa. Evite la carne cruda y el jugo, la leche y el queso sin pasteurizar. Estos portan grmenes que pueden provocar dao tanto a usted como al beb. Tome 4 o 5 comidas pequeas en lugar de 3 comidas abundantes al da. Es posible que deba tomar medidas para prevenir o tratar los problemas para defecar: Beber suficiente lquido para mantener el pis (orina) de color amarillo plido. Come alimentos ricos en fibra. Entre ellos, frijoles, cereales integrales y frutas y verduras frescas. Limitar los alimentos con alto contenido de grasa y azcar. Estos incluyen alimentos fritos o dulces. Actividad Haga ejercicios solamente como se lo haya indicado el mdico. Interrumpa la actividad fsica si comienza a tener clicos en el tero. Evite levantar pesos excesivos. No haga ejercicio si hace demasiado calor, hay demasiada humedad o se encuentra en un lugar de mucha   altura (altitud elevada). Si lo desea, puede continuar teniendo relaciones sexuales, a menos que el mdico le indique lo contrario. Alivio del dolor y del malestar Haga pausas con frecuencia y descanse con las piernas levantadas (elevadas) si tiene calambres en las piernas o dolor en la parte  baja de la espalda. Dese baos de asiento con agua tibia para aliviar el dolor o las molestias causadas por las hemorroides. Use una crema para las hemorroides si el mdico la autoriza. Use un sostn que le brinde buen soporte si sus mamas estn sensibles. Si desarrolla venas hinchadas y abultadas en las piernas: Use medias de compresin segn las indicaciones de su mdico. Levante los pies durante 15 minutos, 3 o 4 veces por da. Limite la sal en sus alimentos. Seguridad Hable con el mdico antes de recorrer largas distancias. No se d baos de inmersin en agua caliente, baos turcos ni saunas. Use el cinturn de seguridad en todo momento mientras vaya en auto. Hable con el mdico si alguien le est haciendo dao o gritando mucho. Preparacin para la llegada del beb Para prepararse para la llegada de su beb: Tome clases prenatales. Visite el hospital y recorra el rea de maternidad. Compre un asiento de seguridad orientado hacia atrs para llevar al beb en el automvil. Aprenda cmo instalarlo en el auto. Prepare la habitacin del beb. Saque todas las almohadas y los animales de peluche de la cuna del beb. Instrucciones generales Evite el contacto con las bandejas sanitarias de los gatos y la tierra que estos animales usan. Estos contienen grmenes que pueden daar al beb y causar la prdida del beb ya sea aborto espontneo o muerte fetal. No se haga duchas vaginales ni use tampones. No use tampones ni toallas higinicas perfumadas. No fume ni consuma ningn producto que contenga nicotina o tabaco. Si necesita ayuda para dejar de fumar, consulte al mdico. No beba alcohol. No use medicamentos a base de hierbas, drogas ilegales, ni medicamentos que el mdico no haya autorizado. Las sustancias qumicas de estos productos pueden afectar al beb. Cumpla con todas las visitas de seguimiento. Esto es importante. Dnde buscar ms informacin American Pregnancy Association (Asociacin  Americana del Embarazo): americanpregnancy.org American College of Obstetricians and Gynecologists (Colegio Estadounidense de Obstetras y Gineclogos): www.acog.org Office on Women's Health (Oficina para la Salud de la Mujer): womenshealth.gov/pregnancy Comunquese con un mdico si: Tiene fiebre. Tiene clicos leves o siente presin en la parte baja del vientre. Sufre un dolor persistente en el abdomen. Vomita o hace deposiciones acuosas (diarrea). Advierte lquido con mal olor que proviene de la vagina. Siente dolor al orinar o hace orina con mal olor. Tiene un dolor de cabeza que no desaparece despus de tomar analgsicos. Nota cambios en la visin o ve manchas delante de los ojos. Solicite ayuda de inmediato si: Rompe la bolsa. Tiene contracciones regulares separadas por menos de 5 minutos. Tiene sangrado o pequeas prdidas vaginales. Tiene clicos o dolor muy intensos en el vientre. Tiene dificultad para respirar. Sientes dolor en el pecho. Se desmaya. No ha sentido al beb moverse durante el tiempo que le indic el mdico. Tiene dolor, hinchazn o enrojecimiento nuevos en un brazo o una pierna o se produce un aumento de alguno de estos sntomas. Resumen El tercer trimestre comprende desde la semana 28 hasta la semana 40 (desde el mes 7 hasta el mes 9). Esta es la poca en que el beb en gestacin crece muy rpidamente. Durante este perodo, las molestias pueden aumentar a medida que usted   sube de peso y el beb crece. Preprese para la llegada del beb: asista a las clases prenatales, compre un asiento de seguridad orientado hacia atrs para llevar al beb en auto y prepare la habitacin del beb. Solicite ayuda de inmediato si tiene sangrado por la vagina, siente dolor en el pecho y tiene dificultad para respirar, o si no ha sentido al beb moverse durante el tiempo que le indic el mdico. Esta informacin no tiene como fin reemplazar el consejo del mdico. Asegrese de hacerle al  mdico cualquier pregunta que tenga. Document Revised: 07/23/2019 Document Reviewed: 07/23/2019 Elsevier Patient Education  2023 Elsevier Inc.  

## 2021-10-20 NOTE — Progress Notes (Signed)
Subjective:  Roberta Hodges is a 26 y.o. G1P0 at [redacted]w[redacted]d being seen today for ongoing prenatal care.  She is currently monitored for the following issues for this low-risk pregnancy and has Supervision of low-risk first pregnancy and Hepatitis C antibody test positive on their problem list.  Patient reports general discomforts of pregnancy.  Contractions: Not present. Vag. Bleeding: None.  Movement: Present. Denies leaking of fluid.   The following portions of the patient's history were reviewed and updated as appropriate: allergies, current medications, past family history, past medical history, past social history, past surgical history and problem list. Problem list updated.  Objective:   Vitals:   10/20/21 1032  BP: 117/66  Pulse: 95  Weight: 176 lb 1.6 oz (79.9 kg)    Fetal Status: Fetal Heart Rate (bpm): 156   Movement: Present     General:  Alert, oriented and cooperative. Patient is in no acute distress.  Skin: Skin is warm and dry. No rash noted.   Cardiovascular: Normal heart rate noted  Respiratory: Normal respiratory effort, no problems with respiration noted  Abdomen: Soft, gravid, appropriate for gestational age. Pain/Pressure: Present     Pelvic:  Cervical exam deferred        Extremities: Normal range of motion.  Edema: None  Mental Status: Normal mood and affect. Normal behavior. Normal judgment and thought content.   Urinalysis:      Assessment and Plan:  Pregnancy: G1P0 at [redacted]w[redacted]d  1. Encounter for supervision of low-risk first pregnancy in third trimester Stable  2. Hepatitis C antibody test positive F/U Hep c negative.  Preterm labor symptoms and general obstetric precautions including but not limited to vaginal bleeding, contractions, leaking of fluid and fetal movement were reviewed in detail with the patient. Please refer to After Visit Summary for other counseling recommendations.  No follow-ups on file.   Chancy Milroy, MD

## 2021-11-03 ENCOUNTER — Other Ambulatory Visit: Payer: Self-pay

## 2021-11-03 ENCOUNTER — Ambulatory Visit (INDEPENDENT_AMBULATORY_CARE_PROVIDER_SITE_OTHER): Payer: Medicaid Other | Admitting: Certified Nurse Midwife

## 2021-11-03 ENCOUNTER — Other Ambulatory Visit (HOSPITAL_COMMUNITY)
Admission: RE | Admit: 2021-11-03 | Discharge: 2021-11-03 | Disposition: A | Discharge status: Home / Self Care | Payer: Medicaid Other | Source: Ambulatory Visit | Attending: Obstetrics & Gynecology | Admitting: Obstetrics & Gynecology

## 2021-11-03 VITALS — BP 123/75 | HR 93 | Wt 180.2 lb

## 2021-11-03 DIAGNOSIS — Z3A36 36 weeks gestation of pregnancy: Secondary | ICD-10-CM | POA: Insufficient documentation

## 2021-11-03 DIAGNOSIS — Z3403 Encounter for supervision of normal first pregnancy, third trimester: Secondary | ICD-10-CM

## 2021-11-03 DIAGNOSIS — Z789 Other specified health status: Secondary | ICD-10-CM

## 2021-11-04 LAB — GC/CHLAMYDIA PROBE AMP (~~LOC~~) NOT AT ARMC
Chlamydia: NEGATIVE
Comment: NEGATIVE
Comment: NORMAL
Neisseria Gonorrhea: NEGATIVE

## 2021-11-05 NOTE — Progress Notes (Signed)
   PRENATAL VISIT NOTE  Subjective:  Roberta Hodges is a 26 y.o. G1P0 at [redacted]w[redacted]d being seen today for ongoing prenatal care.  She is currently monitored for the following issues for this low-risk pregnancy and has Supervision of low-risk first pregnancy and Hepatitis C antibody test positive on their problem list.  Patient reports  occasional braxton hicks  .  Contractions: Irritability. Vag. Bleeding: None.  Movement: Present. Denies leaking of fluid.   The following portions of the patient's history were reviewed and updated as appropriate: allergies, current medications, past family history, past medical history, past social history, past surgical history and problem list.   Objective:   Vitals:   11/03/21 1100  BP: 123/75  Pulse: 93  Weight: 180 lb 3.2 oz (81.7 kg)    Fetal Status: Fetal Heart Rate (bpm): 147 Fundal Height: 36 cm Movement: Present     General:  Alert, oriented and cooperative. Patient is in no acute distress.  Skin: Skin is warm and dry. No rash noted.   Cardiovascular: Normal heart rate noted  Respiratory: Normal respiratory effort, no problems with respiration noted  Abdomen: Soft, gravid, appropriate for gestational age.  Pain/Pressure: Present     Pelvic: Cervical exam deferred        Extremities: Normal range of motion.  Edema: None  Mental Status: Normal mood and affect. Normal behavior. Normal judgment and thought content.   Assessment and Plan:  Pregnancy: G1P0 at [redacted]w[redacted]d 1. Encounter for supervision of low-risk first pregnancy in third trimester - Patient feeling frequent and vigorous fetal movement,  - GC/Chlamydia probe amp ([place])not at [facility] - Culture, beta strep (group b only) - Reasurred  patient that braxton hicks contractions are a normal finding in pregnancy at this stage.   2. Language barrier - Due to language barrier, an interpreter was present during the history-taking and subsequent discussion (and for part of the physical  exam) with this patient. - In-person Spanish interpreter "Eda" used for the duration of this visit.   3. [redacted] weeks gestation of pregnancy - Reviewed change in prenatal schedule from bi-weekly to weekly in pregnancy.  - GC/Chlamydia probe amp ()not at [facility] - Culture, beta strep (group b only)  Preterm labor symptoms and general obstetric precautions including but not limited to vaginal bleeding, contractions, leaking of fluid and fetal movement were reviewed in detail with the patient. Please refer to After Visit Summary for other counseling recommendations.   Return in about 1 week (around 11/10/2021) for LOB.  Future Appointments  Date Time Provider [place]  11/10/2021 10:15 AM Manya Silvas, Mallory Shirk [facility]  11/17/2021 10:35 AM Donnamae Jude, MD [facility]  11/24/2021 11:15 AM Griffin Basil, MD [facility]  12/01/2021 11:15 AM Donnamae Jude, MD [facility]    Lindsay Straka Isaias Sakai) Rollene Rotunda, MSN, [place] for [facility]  11/05/21 3:25 PM

## 2021-11-07 LAB — CULTURE, BETA STREP (GROUP B ONLY): Strep Gp B Culture: NEGATIVE

## 2021-11-10 ENCOUNTER — Ambulatory Visit (INDEPENDENT_AMBULATORY_CARE_PROVIDER_SITE_OTHER): Payer: Medicaid Other | Admitting: Advanced Practice Midwife

## 2021-11-10 ENCOUNTER — Other Ambulatory Visit: Payer: Self-pay

## 2021-11-10 VITALS — BP 120/66 | HR 92 | Wt 179.2 lb

## 2021-11-10 DIAGNOSIS — Z23 Encounter for immunization: Secondary | ICD-10-CM | POA: Diagnosis not present

## 2021-11-10 DIAGNOSIS — R768 Other specified abnormal immunological findings in serum: Secondary | ICD-10-CM

## 2021-11-10 DIAGNOSIS — Z3403 Encounter for supervision of normal first pregnancy, third trimester: Secondary | ICD-10-CM | POA: Diagnosis not present

## 2021-11-10 DIAGNOSIS — H938X1 Other specified disorders of right ear: Secondary | ICD-10-CM

## 2021-11-10 DIAGNOSIS — Z3A37 37 weeks gestation of pregnancy: Secondary | ICD-10-CM

## 2021-11-10 NOTE — Addendum Note (Signed)
Addended by: Donell Beers T on: 11/10/2021 11:34 AM   Modules accepted: Orders

## 2021-11-10 NOTE — Progress Notes (Addendum)
   PRENATAL VISIT NOTE  Subjective:  Roberta Hodges is a 26 y.o. G1P0 at [redacted]w[redacted]d being seen today for ongoing prenatal care.  She is currently monitored for the following issues for this low-risk pregnancy and has Supervision of low-risk first pregnancy and Hepatitis C antibody test positive on their problem list.  Patient reports occasional contractions, fluttering sensation in right ear x 2 days. Concerned that BP is too low (90'/60's) Denies   Contractions: Irritability. Vag. Bleeding: None.  Movement: Present. Denies leaking of fluid.   The following portions of the patient's history were reviewed and updated as appropriate: allergies, current medications, past family history, past medical history, past social history, past surgical history and problem list.   Objective:   Vitals:   11/10/21 1043  BP: 120/66  Pulse: 92  Weight: 179 lb 3.2 oz (81.3 kg)    Fetal Status: Fetal Heart Rate (bpm): 140   Movement: Present     General:  Alert, oriented and cooperative. Patient is in no acute distress.  Skin: Skin is warm and dry. No rash noted.   Ear  No wax, fluid or foreign body.   Cardiovascular: Normal heart rate noted  Respiratory: Normal respiratory effort, no problems with respiration noted  Abdomen: Soft, gravid, appropriate for gestational age.  Pain/Pressure: Present     Pelvic: Cervical exam deferred        Extremities: Normal range of motion.  Edema: Trace  Mental Status: Normal mood and affect. Normal behavior. Normal judgment and thought content.   Assessment and Plan:  Pregnancy: G1P0 at [redacted]w[redacted]d 1. Encounter for supervision of low-risk first pregnancy in third trimester - GBS neg  2. Hepatitis C antibody test positive - False pos   3. [redacted] weeks gestation of pregnancy   4. Abnormal ear sensation, right - Nml exam. If no improvement in next week go to PCP.    Term labor symptoms and general obstetric precautions including but not limited to vaginal bleeding,  contractions, leaking of fluid and fetal movement were reviewed in detail with the patient. Please refer to After Visit Summary for other counseling recommendations.   No follow-ups on file.  Future Appointments  Date Time Provider [place]  11/17/2021 10:35 AM Donnamae Jude, MD [facility]  11/24/2021 11:15 AM Griffin Basil, MD [facility]  12/01/2021 11:15 AM Donnamae Jude, MD [facility], CNM

## 2021-11-10 NOTE — Patient Instructions (Signed)
[email or web address]  

## 2021-11-17 ENCOUNTER — Other Ambulatory Visit: Payer: Self-pay

## 2021-11-17 ENCOUNTER — Ambulatory Visit (INDEPENDENT_AMBULATORY_CARE_PROVIDER_SITE_OTHER): Payer: Medicaid Other | Admitting: Family Medicine

## 2021-11-17 DIAGNOSIS — Z3A38 38 weeks gestation of pregnancy: Secondary | ICD-10-CM

## 2021-11-17 DIAGNOSIS — Z3403 Encounter for supervision of normal first pregnancy, third trimester: Secondary | ICD-10-CM

## 2021-11-17 NOTE — Progress Notes (Signed)
   PRENATAL VISIT NOTE  Subjective:  Roberta Hodges is a 26 y.o. G1P0 at [redacted]w[redacted]d being seen today for ongoing prenatal care.  She is currently monitored for the following issues for this low-risk pregnancy and has Supervision of low-risk first pregnancy on their problem list.  Patient reports no complaints.  Contractions: Irritability.  .  Movement: Present. Denies leaking of fluid.   The following portions of the patient's history were reviewed and updated as appropriate: allergies, current medications, past family history, past medical history, past social history, past surgical history and problem list.   Objective:   Vitals:   11/17/21 1106  BP: 111/77  Pulse: 91  Weight: 179 lb (81.2 kg)    Fetal Status: Fetal Heart Rate (bpm): 150 Fundal Height: 37 cm Movement: Present     General:  Alert, oriented and cooperative. Patient is in no acute distress.  Skin: Skin is warm and dry. No rash noted.   Cardiovascular: Normal heart rate noted  Respiratory: Normal respiratory effort, no problems with respiration noted  Abdomen: Soft, gravid, appropriate for gestational age.  Pain/Pressure: Present     Pelvic: Cervical exam deferred        Extremities: Normal range of motion.  Edema: Trace  Mental Status: Normal mood and affect. Normal behavior. Normal judgment and thought content.   Assessment and Plan:  Pregnancy: G1P0 at [redacted]w[redacted]d 1. Encounter for supervision of low-risk first pregnancy in third trimester Continue routine prenatal care.   Term labor symptoms and general obstetric precautions including but not limited to vaginal bleeding, contractions, leaking of fluid and fetal movement were reviewed in detail with the patient. Please refer to After Visit Summary for other counseling recommendations.   Return in 1 week (on 11/24/2021).  Future Appointments  Date Time Provider [place]  11/24/2021 11:15 AM Griffin Basil, MD [facility]  12/01/2021 10:35 AM Johnston Ebbs, NP [facility]    Donnamae Jude, MD

## 2021-11-24 ENCOUNTER — Ambulatory Visit (INDEPENDENT_AMBULATORY_CARE_PROVIDER_SITE_OTHER): Payer: Medicaid Other | Admitting: Obstetrics and Gynecology

## 2021-11-24 ENCOUNTER — Encounter: Payer: Medicaid Other | Admitting: Student

## 2021-11-24 ENCOUNTER — Other Ambulatory Visit: Payer: Self-pay

## 2021-11-24 VITALS — BP 111/66 | HR 86 | Wt 181.9 lb

## 2021-11-24 DIAGNOSIS — Z3490 Encounter for supervision of normal pregnancy, unspecified, unspecified trimester: Secondary | ICD-10-CM

## 2021-11-24 DIAGNOSIS — Z3A39 39 weeks gestation of pregnancy: Secondary | ICD-10-CM

## 2021-11-24 DIAGNOSIS — Z3403 Encounter for supervision of normal first pregnancy, third trimester: Secondary | ICD-10-CM

## 2021-11-24 DIAGNOSIS — Z3493 Encounter for supervision of normal pregnancy, unspecified, third trimester: Secondary | ICD-10-CM

## 2021-11-24 NOTE — Progress Notes (Signed)
   PRENATAL VISIT NOTE  Subjective:  Roberta Hodges is a 26 y.o. G1P0 at [redacted]w[redacted]d being seen today for ongoing prenatal care.  She is currently monitored for the following issues for this low-risk pregnancy and has Supervision of low-risk first pregnancy on their problem list.  Patient doing well with no acute concerns today. She reports no complaints.  Contractions: Irritability. Vag. Bleeding: None.   . Denies leaking of fluid.   The following portions of the patient's history were reviewed and updated as appropriate: allergies, current medications, past family history, past medical history, past social history, past surgical history and problem list. Problem list updated.  Objective:   Vitals:   11/24/21 1159  BP: 111/66  Pulse: 86  Weight: 181 lb 14.4 oz (82.5 kg)    Fetal Status: Fetal Heart Rate (bpm): 159         General:  Alert, oriented and cooperative. Patient is in no acute distress.  Skin: Skin is warm and dry. No rash noted.   Cardiovascular: Normal heart rate noted  Respiratory: Normal respiratory effort, no problems with respiration noted  Abdomen: Soft, gravid, appropriate for gestational age.  Pain/Pressure: Present     Pelvic: Cervical exam deferred        Extremities: Normal range of motion.  Edema: Trace  Mental Status:  Normal mood and affect. Normal behavior. Normal judgment and thought content.   Assessment and Plan:  Pregnancy: G1P0 at [redacted]w[redacted]d  1. [redacted] weeks gestation of pregnancy   2. Encounter for supervision of low-risk first pregnancy in third trimester Elective induction of labor scheduled  Term labor symptoms and general obstetric precautions including but not limited to vaginal bleeding, contractions, leaking of fluid and fetal movement were reviewed in detail with the patient.  Please refer to After Visit Summary for other counseling recommendations.   Return in about 1 week (around 12/01/2021) for ROB, in person, NST at next  visit.   Lynnda Shields, MD Faculty Attending [facility] for [facility]

## 2021-11-25 ENCOUNTER — Other Ambulatory Visit: Payer: Self-pay | Admitting: Advanced Practice Midwife

## 2021-11-29 ENCOUNTER — Telehealth (HOSPITAL_COMMUNITY): Payer: Self-pay | Admitting: *Deleted

## 2021-11-29 ENCOUNTER — Encounter (HOSPITAL_COMMUNITY): Payer: Self-pay | Admitting: *Deleted

## 2021-11-29 NOTE — Telephone Encounter (Signed)
Preadmission screen interpreter number [phone]

## 2021-12-01 ENCOUNTER — Inpatient Hospital Stay (HOSPITAL_COMMUNITY): Payer: Medicaid Other

## 2021-12-01 ENCOUNTER — Encounter: Payer: Medicaid Other | Admitting: Student

## 2021-12-01 ENCOUNTER — Other Ambulatory Visit: Payer: Medicaid Other

## 2021-12-02 ENCOUNTER — Encounter (HOSPITAL_COMMUNITY): Payer: Self-pay | Admitting: Obstetrics and Gynecology

## 2021-12-02 ENCOUNTER — Other Ambulatory Visit: Payer: Self-pay

## 2021-12-02 ENCOUNTER — Inpatient Hospital Stay (HOSPITAL_COMMUNITY)
Admission: RE | Admit: 2021-12-02 | Discharge: 2021-12-06 | DRG: 787 | Disposition: A | Discharge status: Home / Self Care | Payer: Medicaid Other | Attending: Obstetrics and Gynecology | Admitting: Obstetrics and Gynecology

## 2021-12-02 DIAGNOSIS — O9081 Anemia of the puerperium: Secondary | ICD-10-CM | POA: Diagnosis not present

## 2021-12-02 DIAGNOSIS — Z98891 History of uterine scar from previous surgery: Principal | ICD-10-CM

## 2021-12-02 DIAGNOSIS — O480 Post-term pregnancy: Secondary | ICD-10-CM | POA: Diagnosis present

## 2021-12-02 DIAGNOSIS — Z3490 Encounter for supervision of normal pregnancy, unspecified, unspecified trimester: Secondary | ICD-10-CM | POA: Diagnosis present

## 2021-12-02 DIAGNOSIS — Z3A40 40 weeks gestation of pregnancy: Secondary | ICD-10-CM

## 2021-12-02 DIAGNOSIS — D62 Acute posthemorrhagic anemia: Secondary | ICD-10-CM | POA: Diagnosis not present

## 2021-12-02 LAB — CBC
HCT: 36.5 % (ref 36.0–46.0)
Hemoglobin: 12.3 g/dL (ref 12.0–15.0)
MCH: 29.2 pg (ref 26.0–34.0)
MCHC: 33.7 g/dL (ref 30.0–36.0)
MCV: 86.7 fL (ref 80.0–100.0)
Platelets: 311 10*3/uL (ref 150–400)
RBC: 4.21 MIL/uL (ref 3.87–5.11)
RDW: 14.1 % (ref 11.5–15.5)
WBC: 8.5 10*3/uL (ref 4.0–10.5)
nRBC: 0 % (ref 0.0–0.2)

## 2021-12-02 LAB — TYPE AND SCREEN
ABO/RH(D): B POS
Antibody Screen: NEGATIVE

## 2021-12-02 MED ORDER — MISOPROSTOL 50MCG HALF TABLET
50.0000 ug | ORAL_TABLET | Freq: Once | ORAL | Status: AC
  Administered 2021-12-02: 50 ug via ORAL
  Filled 2021-12-02: qty 1

## 2021-12-02 MED ORDER — OXYTOCIN-SODIUM CHLORIDE 30-0.9 UT/500ML-% IV SOLN
1.0000 m[IU]/min | INTRAVENOUS | Status: DC
  Administered 2021-12-02 – 2021-12-03 (×3): 2 m[IU]/min via INTRAVENOUS
  Filled 2021-12-02 (×2): qty 500

## 2021-12-02 MED ORDER — EPHEDRINE 5 MG/ML INJ: 10.0000 mg | INTRAVENOUS | Status: DC | PRN

## 2021-12-02 MED ORDER — LACTATED RINGERS IV SOLN: INTRAVENOUS | Status: DC

## 2021-12-02 MED ORDER — DIPHENHYDRAMINE HCL 50 MG/ML IJ SOLN
12.5000 mg | INTRAMUSCULAR | Status: DC | PRN
  Administered 2021-12-03: 12.5 mg via INTRAVENOUS
  Filled 2021-12-02: qty 1

## 2021-12-02 MED ORDER — ONDANSETRON HCL 4 MG/2ML IJ SOLN: 4.0000 mg | Freq: Four times a day (QID) | INTRAMUSCULAR | Status: DC | PRN

## 2021-12-02 MED ORDER — LACTATED RINGERS IV SOLN
500.0000 mL | Freq: Once | INTRAVENOUS | Status: AC
  Administered 2021-12-03: 500 mL via INTRAVENOUS

## 2021-12-02 MED ORDER — OXYCODONE-ACETAMINOPHEN 5-325 MG PO TABS: 1.0000 | ORAL_TABLET | ORAL | Status: DC | PRN

## 2021-12-02 MED ORDER — OXYTOCIN-SODIUM CHLORIDE 30-0.9 UT/500ML-% IV SOLN: 2.5000 [IU]/h | INTRAVENOUS | Status: DC

## 2021-12-02 MED ORDER — FENTANYL CITRATE (PF) 100 MCG/2ML IJ SOLN
50.0000 ug | INTRAMUSCULAR | Status: DC | PRN
  Administered 2021-12-02 – 2021-12-03 (×5): 100 ug via INTRAVENOUS
  Filled 2021-12-02 (×5): qty 2

## 2021-12-02 MED ORDER — MISOPROSTOL 25 MCG QUARTER TABLET
25.0000 ug | ORAL_TABLET | Freq: Once | ORAL | Status: AC
  Administered 2021-12-02: 25 ug via VAGINAL
  Filled 2021-12-02: qty 1

## 2021-12-02 MED ORDER — PHENYLEPHRINE 80 MCG/ML (10ML) SYRINGE FOR IV PUSH (FOR BLOOD PRESSURE SUPPORT): 80.0000 ug | PREFILLED_SYRINGE | INTRAVENOUS | Status: DC | PRN

## 2021-12-02 MED ORDER — ACETAMINOPHEN 325 MG PO TABS: 650.0000 mg | ORAL_TABLET | ORAL | Status: DC | PRN

## 2021-12-02 MED ORDER — LACTATED RINGERS IV SOLN
500.0000 mL | INTRAVENOUS | Status: DC | PRN
  Administered 2021-12-03 – 2021-12-04 (×2): 500 mL via INTRAVENOUS

## 2021-12-02 MED ORDER — TERBUTALINE SULFATE 1 MG/ML IJ SOLN
0.2500 mg | Freq: Once | INTRAMUSCULAR | Status: DC | PRN
  Filled 2021-12-02: qty 1

## 2021-12-02 MED ORDER — LIDOCAINE HCL (PF) 1 % IJ SOLN: 30.0000 mL | INTRAMUSCULAR | Status: DC | PRN

## 2021-12-02 MED ORDER — SOD CITRATE-CITRIC ACID 500-334 MG/5ML PO SOLN
30.0000 mL | ORAL | Status: DC | PRN
  Filled 2021-12-02: qty 30

## 2021-12-02 MED ORDER — FENTANYL-BUPIVACAINE-NACL 0.5-0.125-0.9 MG/250ML-% EP SOLN
12.0000 mL/h | EPIDURAL | Status: DC | PRN
  Filled 2021-12-02: qty 250

## 2021-12-02 MED ORDER — OXYTOCIN BOLUS FROM INFUSION: 333.0000 mL | Freq: Once | INTRAVENOUS | Status: DC

## 2021-12-02 NOTE — H&P (Addendum)
LABOR AND DELIVERY ADMISSION HISTORY AND PHYSICAL NOTE  Roberta Hodges is a 26 y.o. female G1P0 with IUP at 25w4dby certain LMP presenting for IOL for post dates.  She reports positive fetal movement. She denies leakage of fluid or vaginal bleeding. Denies CTX. Is having some RUQ pressure, but no HA, CP, SOB, or vision changes. Denies N/V. Was having some palpitations when she felt nervous.  [facility]:  - (False positive HCV)  Past Medical History: Past Medical History:  Diagnosis Date   GERD (gastroesophageal reflux disease)    Medical history non-contributory     Past Surgical History: Past Surgical History:  Procedure Laterality Date   NO PAST SURGERIES     WISDOM TOOTH EXTRACTION      Obstetrical History: OB History     Gravida  1   Para      Term      Preterm      AB      Living         SAB      IAB      Ectopic      Multiple      Live Births  0           Social History: Social History   Socioeconomic History   Marital status: Single    Spouse name: Not on file   Number of children: Not on file   Years of education: Not on file   Highest education level: Not on file  Occupational History   Not on file  Tobacco Use   Smoking status: Never   Smokeless tobacco: Never  Vaping Use   Vaping Use: Former  Substance and Sexual Activity   Alcohol use: Not Currently   Drug use: Not Currently   Sexual activity: Yes  Other Topics Concern   Not on file  Social History Narrative   Not on file   Social Determinants of Health   Financial Resource Strain: Not on file  Food Insecurity: No Food Insecurity (11/17/2021)   Hunger Vital Sign    Worried About Running Out of Food in the Last Year: Never true    Ran Out of Food in the Last Year: Never true  Transportation Needs: No Transportation Needs (11/17/2021)   PRAPARE - THydrologist(Medical): No     Lack of Transportation (Non-Medical): No  Physical Activity: Not on file  Stress: Not on file  Social Connections: Not on file    Family History: Family History  Problem Relation Age of Onset   Diabetes Father    Cancer Maternal Uncle    Asthma Neg Hx    Heart disease Neg Hx    Hypertension Neg Hx    Stroke Neg Hx     Allergies: No Known Allergies  Medications Prior to Admission  Medication Sig Dispense Refill Last Dose   Blood Pressure Monitoring (BLOOD PRESSURE KIT) DEVI 1 Device by Does not apply route as needed. 1 each 0    Misc. Devices (GOJJI WEIGHT SCALE) MISC 1 Device by Does not apply route as needed. 1 each 0    Prenatal Vit-Fe Fumarate-FA (PRENATAL PO) Take by mouth.        Review of Systems  All systems reviewed and negative except as stated in HPI  Physical Exam Blood pressure 111/67, pulse 82, temperature 98.2 F (36.8 C), temperature source Oral, resp. rate 18, height _0  (1.499 m),  weight 82.9 kg, last menstrual period 02/21/2021. General appearance: alert, oriented, NAD Lungs: normal respiratory effort Heart: regular rate and rhythm  Abdomen: soft, non-tender; gravid, FH appropriate for GA Extremities: No calf erythema or tenderness. 2+ edema Presentation: cephalic. Baby lying on R side where pt feels RUQ pressure Fetal monitoring: 135/moderate variability/15x15 accels, some variables Uterine activity: q 2-3 min CTX Dilation: Closed Effacement (%): 50 Station: -3 Exam by:: J.Cox, RN  Prenatal labs: ABO, Rh: --/--/B POS (11/10 0755) Antibody: NEG (11/10 0755) Rubella: 3.69 (05/17 1321) RPR: Non Reactive (08/17 0839)  HBsAg: Negative (05/17 1321)  HIV: Non Reactive (08/17 0839)  GC/Chlamydia: negative GBS: Negative/-- (10/12 1154)  2-hr GTT: passed Genetic screening: wnml Anatomy US: nml  Prenatal Transfer Tool  Maternal Diabetes: No Genetic Screening: Normal Maternal Ultrasounds/Referrals: Normal Fetal Ultrasounds or other Referrals:   Other: Had f/u growth ultrasound due to concern for HCV, but HCV was false positive, and [place] wnml Maternal Substance Abuse:  No Significant Maternal Medications:  None Significant Maternal Lab Results: Group B Strep negative  Results for orders placed or performed during the hospital encounter of 12/02/21 (from the past 24 hour(s))  Type and screen   Collection Time: 12/02/21  7:55 AM  Result Value Ref Range   ABO/RH(D) B POS    Antibody Screen NEG    Sample Expiration      12/05/2021,2359 Performed at [facility], [place] [address] [place], [place] [address]   CBC   Collection Time: 12/02/21  7:57 AM  Result Value Ref Range   WBC 8.5 4.0 - 10.5 K/uL   RBC 4.21 3.87 - 5.11 MIL/uL   Hemoglobin 12.3 12.0 - 15.0 g/dL   HCT 36.5 36.0 - 46.0 %   MCV 86.7 80.0 - 100.0 fL   MCH 29.2 26.0 - 34.0 pg   MCHC 33.7 30.0 - 36.0 g/dL   RDW 14.1 11.5 - 15.5 %   Platelets 311 150 - 400 K/uL   nRBC 0.0 0.0 - 0.2 %    Patient Active Problem List   Diagnosis Date Noted   Encounter for elective induction of labor 12/02/2021   Supervision of low-risk first pregnancy 06/08/2021    Assessment: Roberta Hodges is a 26 y.o. G1P0 at 71w4dhere for IOL for post dates  #Labor: Will induce with cytotec 50/25 and recheck in 4 hours #Pain: IV pain meds when needed, epidural #FWB: Cat 1, there were a couple variables, but these have not recurred. Will CTM. #ID:  negative #MOF: Breast/bottle #MOC: Depo outpatient #Circ:  N/A girl  MWilhemina Cash11/10/2021, 9:48 AM   Attestation of CNM Supervision of Resident: Evaluation and management procedures were performed by the [facility] under my supervision. I was immediately available for direct supervision, assistance and direction throughout this encounter.  I also confirm that I have verified the information documented in the resident's note, and that I have also personally reperformed the pertinent components of the physical  exam and all of the medical decision making activities.  I have also made any necessary editorial changes.  DRenee Harder CNM 12/02/2021 1:23 PM

## 2021-12-02 NOTE — Progress Notes (Signed)
LABOR PROGRESS NOTE  Roberta Hodges is a 26 y.o. G1P0 at [redacted]w[redacted]d  admitted for IOL for postdates  Subjective: Doing well. Having pain in her low back and low belly that comes and goes every five minutes. Having a little bit of bleeding. Ambulating to the bathroom.  Objective: BP 116/60   Pulse 79   Temp 98.2 F (36.8 C) (Oral)   Resp 18   Ht 4\' 11"  (1.499 m)   Wt 82.9 kg   LMP 02/21/2021 (Exact Date)   BMI 36.92 kg/m  or  Vitals:   12/02/21 1207 12/02/21 1256 12/02/21 1552 12/02/21 1553  BP: 124/77 120/71  116/60  Pulse: 80 84  79  Resp:      Temp: 98.2 F (36.8 C)  98.2 F (36.8 C)   TempSrc: Oral  Oral   Weight:      Height:        FHT: baseline rate 150, moderate variability, 15x15 accels, no decels Toco: CTX q4-5 minutes  Labs: Lab Results  Component Value Date   WBC 8.5 12/02/2021   HGB 12.3 12/02/2021   HCT 36.5 12/02/2021   MCV 86.7 12/02/2021   PLT 311 12/02/2021    Patient Active Problem List   Diagnosis Date Noted   Encounter for elective induction of labor 12/02/2021   Supervision of low-risk first pregnancy 06/08/2021    Assessment / Plan: 26 y.o. G1P0 at [redacted]w[redacted]d here for IOL for post dates  Labor: Foley bulb in. Deferred check. Will add 2u of pit. Fetal Wellbeing:  Cat 1 Pain Control:  Planning epidural Anticipated MOD:  vaginal  [redacted]w[redacted]d, MD 12/02/2021 6:00 PM

## 2021-12-02 NOTE — Progress Notes (Addendum)
LABOR PROGRESS NOTE  Roberta Hodges is a 26 y.o. G1P0 at [redacted]w[redacted]d  admitted for IOL for postdates  Subjective: Doing well at the moment. No contractions. Was walking around.  Objective: BP 120/71   Pulse 84   Temp 98.2 F (36.8 C) (Oral)   Resp 18   Ht 4\' 11"  (1.499 m)   Wt 82.9 kg   LMP 02/21/2021 (Exact Date)   BMI 36.92 kg/m  or  Vitals:   12/02/21 1022 12/02/21 1108 12/02/21 1207 12/02/21 1256  BP: 116/74 108/71 124/77 120/71  Pulse: 86 78 80 84  Resp:      Temp:   98.2 F (36.8 C)   TempSrc:   Oral   Weight:      Height:         Dilation: Fingertip Effacement (%): 50 Station: -3 Presentation: Vertex Exam by:: Jasmin Winberry, CNM FHT: baseline rate 140, moderate varibility, 15x15 accels, no decel Toco: Irritability  Labs: Lab Results  Component Value Date   WBC 8.5 12/02/2021   HGB 12.3 12/02/2021   HCT 36.5 12/02/2021   MCV 86.7 12/02/2021   PLT 311 12/02/2021    Patient Active Problem List   Diagnosis Date Noted   Encounter for elective induction of labor 12/02/2021   Supervision of low-risk first pregnancy 06/08/2021    Assessment / Plan: 26 y.o. G1P0 at [redacted]w[redacted]d here for IOL for post dates  Labor: Fingertip/60/-3. S/p Cytotec 50/25. Placed foley balloon via speculum Fetal Wellbeing:  Cat 1 Pain Control:  None at the moment, but will eventually want epidural Anticipated MOD:  vaginal  [redacted]w[redacted]d, MD PGY-1 Family Medicine Resident [facility]   12/02/2021, 12:57 PM  Attestation of CNM Supervision of Resident: Evaluation and management procedures were performed by the [facility] Medicine Resident under my supervision. I was immediately available for direct supervision, assistance and direction throughout this encounter.  I also confirm that I have verified the information documented in the resident's note, and that I have also personally reperformed the pertinent components of the physical exam and all of the medical decision making  activities.  I have also made any necessary editorial changes.  Foley balloon placed by CNM. Hold Cytotec now as patient contracting too frequently.   [facility], CNM 12/02/2021 1:25 PM

## 2021-12-03 ENCOUNTER — Inpatient Hospital Stay (HOSPITAL_COMMUNITY): Payer: Medicaid Other | Admitting: Anesthesiology

## 2021-12-03 DIAGNOSIS — Z3A40 40 weeks gestation of pregnancy: Secondary | ICD-10-CM

## 2021-12-03 LAB — RPR: RPR Ser Ql: NONREACTIVE

## 2021-12-03 MED ORDER — LIDOCAINE HCL (PF) 1 % IJ SOLN
INTRAMUSCULAR | Status: DC | PRN
  Administered 2021-12-03: 10 mL via EPIDURAL
  Administered 2021-12-03: 2 mL via EPIDURAL

## 2021-12-03 MED ORDER — MISOPROSTOL 25 MCG QUARTER TABLET
25.0000 ug | ORAL_TABLET | ORAL | Status: DC
  Administered 2021-12-03: 25 ug via VAGINAL
  Filled 2021-12-03: qty 1

## 2021-12-03 MED ORDER — FENTANYL-BUPIVACAINE-NACL 0.5-0.125-0.9 MG/250ML-% EP SOLN
EPIDURAL | Status: DC | PRN
  Administered 2021-12-03: 12 mL/h via EPIDURAL

## 2021-12-03 NOTE — Progress Notes (Signed)
LABOR PROGRESS NOTE  Roberta Hodges is a 26 y.o. G1P0 at [redacted]w[redacted]d  admitted for IOL for postdates  Subjective: Doing well feeling some lower abdominal pain and cramping.   Objective: BP 115/68   Pulse 93   Temp 98.2 F (36.8 C) (Oral)   Resp 16   Ht 4\' 11"  (1.499 m)   Wt 82.9 kg   LMP 02/21/2021 (Exact Date)   BMI 36.92 kg/m  or  Vitals:   12/02/21 2202 12/03/21 0141 12/03/21 0332 12/03/21 0442  BP: (!) 101/58 117/63 (!) 109/59 115/68  Pulse: 88 74 80 93  Resp: 16 16    Temp: 98.5 F (36.9 C) 98.2 F (36.8 C)    TempSrc: Oral Oral    Weight:      Height:        FHT: baseline rate 140, moderate variability, + accels, no decels Toco: CTX q3-22minutes  Labs: Lab Results  Component Value Date   WBC 8.5 12/02/2021   HGB 12.3 12/02/2021   HCT 36.5 12/02/2021   MCV 86.7 12/02/2021   PLT 311 12/02/2021    Patient Active Problem List   Diagnosis Date Noted   Encounter for elective induction of labor 12/02/2021   Supervision of low-risk first pregnancy 06/08/2021    Assessment / Plan: 26 y.o. G1P0 at [redacted]w[redacted]d here for IOL for post dates  Labor: Cytotec, pitocin break, continue to monitor  Fetal Wellbeing:  Cat 1 Pain Control:  epidural in place  Anticipated MOD:  vaginal  [redacted]w[redacted]d, MD 12/03/2021 6:14 AM

## 2021-12-03 NOTE — Progress Notes (Signed)
Labor Progress Note  Roberta Hodges is a 26 y.o. G1P0 at [redacted]w[redacted]d presented for IOL PD  S: Pt doing well. Does feel some pressure in right upper belly and has some low central back pain  O:  BP 110/70   Pulse (!) 103   Temp 97.6 F (36.4 C) (Oral)   Resp 18   Ht 4\' 11"  (1.499 m)   Wt 82.9 kg   LMP 02/21/2021 (Exact Date)   BMI 36.92 kg/m  EFM: 145 bpm/Moderate variability/ 10x10 accels/occasional early and late decels  CVE: Dilation: 6 Effacement (%): 90 Station: -2 Presentation: Vertex Exam by:: Keoni Havey, MD Toco: CTX q [phone], ~1800 [place] units  Baby lying mostly on R side   A&P: 46 y.o. G1P0 [redacted]w[redacted]d  here for IOL as above  #Labor: Cervix still unchanged with pit on 18-20. Will give patient 4 hours off pit, restart around 11:40. If still no cervical change in four hours, will consider C-section. #Pain: Epidural #FWB: CAT 2. Mom did have some soft BP w/pulse 103. Giving fluids. CTM #GBS negative  06-29-1991, MD PGY-1 [facility], Center for [facility] 12/03/21  7:21 PM

## 2021-12-03 NOTE — Progress Notes (Signed)
LABOR PROGRESS NOTE  Roberta Hodges is a 26 y.o. G1P0 at [redacted]w[redacted]d  admitted for IOL for postdates  Subjective: No concerns. Just received epidural. RN placing foley.  Objective: BP 117/67   Pulse 90   Temp 98.3 F (36.8 C) (Oral)   Resp 18   Ht 4\' 11"  (1.499 m)   Wt 82.9 kg   LMP 02/21/2021 (Exact Date)   BMI 36.92 kg/m  or  Vitals:   12/03/21 1115 12/03/21 1120 12/03/21 1125 12/03/21 1130  BP: 114/66 118/68 112/64 117/67  Pulse: 97 92 92 90  Resp: 18 18 18 18   Temp:      TempSrc:      Weight:      Height:        FHT: baseline rate 150, moderate variability, + accels, no decel Toco: CTX q3-61minutes SVE: RN check was 5 cm  Labs: Lab Results  Component Value Date   WBC 8.5 12/02/2021   HGB 12.3 12/02/2021   HCT 36.5 12/02/2021   MCV 86.7 12/02/2021   PLT 311 12/02/2021    Patient Active Problem List   Diagnosis Date Noted   Encounter for elective induction of labor 12/02/2021   Supervision of low-risk first pregnancy 06/08/2021    Assessment / Plan: 26 y.o. G1P0 at [redacted]w[redacted]d here for IOL for post dates  Labor: On pit 4. Continue. Fetal Wellbeing:  Cat 1 Pain Control:  Planning epidural Anticipated MOD:  vaginal  34, MD 12/03/2021 11:34 AM

## 2021-12-03 NOTE — Progress Notes (Signed)
Labor Progress Note  Roberta Hodges is a 26 y.o. G1P0 at [redacted]w[redacted]d presented for IOL PD  S: Pt doing well, has epidural in place  O:  BP 100/64   Pulse 83   Temp 98.3 F (36.8 C) (Oral)   Resp 18   Ht 4\' 11"  (1.499 m)   Wt 82.9 kg   LMP 02/21/2021 (Exact Date)   BMI 36.92 kg/m  EFM: 140 bpm/Moderate variability/ 15x15 accels/ None decels  CVE: Dilation: 6 Effacement (%): 70 Station: -1 Presentation: Vertex Exam by:: Mercado-Ortiz, MD   A&P: 26 y.o. G1P0 [redacted]w[redacted]d  here for IOL as above  #Labor: Progressing well. Continue pitocin as tolerated by fetus. OP position noted on [redacted]w[redacted]d. Start rotating maternal positions.  #Pain: Epidural #FWB: CAT 1 #GBS negative  [place], DO FMOB Fellow, Faculty practice [facility], Center for [facility] 12/03/21  1:09 PM

## 2021-12-03 NOTE — Anesthesia Preprocedure Evaluation (Addendum)
Anesthesia Evaluation  Patient identified by MRN, date of birth, ID band Patient awake    Reviewed: Allergy & Precautions, NPO status , Patient's Chart, lab work & pertinent test results  Airway Mallampati: II  TM Distance: >3 FB Neck ROM: Full    Dental no notable dental hx.    Pulmonary neg pulmonary ROS   Pulmonary exam normal breath sounds clear to auscultation       Cardiovascular negative cardio ROS Normal cardiovascular exam Rhythm:Regular Rate:Normal     Neuro/Psych negative neurological ROS  negative psych ROS   GI/Hepatic Neg liver ROS,GERD  ,,  Endo/Other  Obestiy BMI 37  Renal/GU negative Renal ROS  negative genitourinary   Musculoskeletal   Abdominal   Peds  Hematology Lab Results      Component                Value               Date                      WBC                      8.5                 12/02/2021                HGB                      12.3                12/02/2021                HCT                      36.5                12/02/2021                MCV                      86.7                12/02/2021                PLT                      311                 12/02/2021              Anesthesia Other Findings   Reproductive/Obstetrics (+) Pregnancy                             Anesthesia Physical Anesthesia Plan  ASA: 3  Anesthesia Plan: Epidural   Post-op Pain Management:    Induction:   PONV Risk Score and Plan:   Airway Management Planned: Natural Airway  Additional Equipment: None  Intra-op Plan:   Post-operative Plan:   Informed Consent: I have reviewed the patients History and Physical, chart, labs and discussed the procedure including the risks, benefits and alternatives for the proposed anesthesia with the patient or authorized representative who has indicated his/her understanding and acceptance.     Interpreter used for  [employer] Discussed with: CRNA  Anesthesia Plan Comments: (40.5  wk primagravids w BMI 36.9 forLEA)       Anesthesia Quick Evaluation

## 2021-12-03 NOTE — Anesthesia Procedure Notes (Signed)
Epidural Patient location during procedure: OB Start time: 12/03/2021 10:46 AM End time: 12/03/2021 10:56 AM  Staffing Anesthesiologist: Lannie Fields, DO Performed: anesthesiologist   Preanesthetic Checklist Completed: patient identified, IV checked, risks and benefits discussed, monitors and equipment checked, pre-op evaluation and timeout performed  Epidural Patient position: sitting Prep: DuraPrep and site prepped and draped Patient monitoring: continuous pulse ox, blood pressure, heart rate and cardiac monitor Approach: midline Location: L3-L4 Injection technique: LOR air  Needle:  Needle type: Tuohy  Needle gauge: 17 G Needle length: 9 cm Needle insertion depth: 5.5 cm Catheter type: closed end flexible Catheter size: 19 Gauge Catheter at skin depth: 11 cm Test dose: negative  Assessment Sensory level: T8 Events: blood not aspirated, injection not painful, no injection resistance, no paresthesia and negative IV test  Additional Notes Patient identified. Risks/Benefits/Options discussed with patient including but not limited to bleeding, infection, nerve damage, paralysis, failed block, incomplete pain control, headache, blood pressure changes, nausea, vomiting, reactions to medication both or allergic, itching and postpartum back pain. Confirmed with bedside nurse the patient's most recent platelet count. Confirmed with patient that they are not currently taking any anticoagulation, have any bleeding history or any family history of bleeding disorders. Patient expressed understanding and wished to proceed. All questions were answered. Sterile technique was used throughout the entire procedure. Please see nursing notes for vital signs. Test dose was given through epidural catheter and negative prior to continuing to dose epidural or start infusion. Warning signs of high block given to the patient including shortness of breath, tingling/numbness in hands, complete motor  block, or any concerning symptoms with instructions to call for help. Patient was given instructions on fall risk and not to get out of bed. All questions and concerns addressed with instructions to call with any issues or inadequate analgesia.  Reason for block:procedure for pain

## 2021-12-03 NOTE — Progress Notes (Signed)
Labor Progress Note  Roberta Hodges is a 26 y.o. G1P0 at [redacted]w[redacted]d presented for IOL PD  S: Pt w/o complaints  O:  BP (!) 94/56   Pulse 95   Temp 98.1 F (36.7 C) (Oral)   Resp 18   Ht 4\' 11"  (1.499 m)   Wt 82.9 kg   LMP 02/21/2021 (Exact Date)   BMI 36.92 kg/m  EFM: 135 bpm/Moderate variability/ 15x15 accels/ None decels  CVE: Dilation: 6 Effacement (%): 70 Station: -1 Presentation: Vertex Exam by:: Mercado-Ortiz, MD   A&P: 26 y.o. G1P0 [redacted]w[redacted]d  here for IOL as above  #Labor: Unchanged. Placed IUPC. Continue pitocin.  #Pain: Epidural #FWB: CAT 1 #GBS negative  [redacted]w[redacted]d, MD PGY-1 [facility], Center for [facility] 12/03/21  4:23 PM

## 2021-12-03 NOTE — Progress Notes (Signed)
LABOR PROGRESS NOTE  Roberta Hodges is a 26 y.o. G1P0 at [redacted]w[redacted]d  admitted for IOL for postdates  Subjective: No concerns at the moment.  Objective: BP 112/65   Pulse 82   Temp 98.3 F (36.8 C) (Oral)   Resp 16   Ht 4\' 11"  (1.499 m)   Wt 82.9 kg   LMP 02/21/2021 (Exact Date)   BMI 36.92 kg/m  or  Vitals:   12/03/21 0141 12/03/21 0332 12/03/21 0442 12/03/21 0711  BP: 117/63 (!) 109/59 115/68 112/65  Pulse: 74 80 93 82  Resp: 16   16  Temp: 98.2 F (36.8 C) 98.1 F (36.7 C)  98.3 F (36.8 C)  TempSrc: Oral Oral  Oral  Weight:      Height:        FHT: baseline rate 150, moderate variability, + accels, occasional variable decel Toco: CTX q3-19minutes SVE: 6/80/-3  Labs: Lab Results  Component Value Date   WBC 8.5 12/02/2021   HGB 12.3 12/02/2021   HCT 36.5 12/02/2021   MCV 86.7 12/02/2021   PLT 311 12/02/2021    Patient Active Problem List   Diagnosis Date Noted   Encounter for elective induction of labor 12/02/2021   Supervision of low-risk first pregnancy 06/08/2021    Assessment / Plan: 26 y.o. G1P0 at [redacted]w[redacted]d here for IOL for post dates  Labor: Progressing slowly. About one cm from last check about 4 hrs ago. Was on pit break. Will restart 2x2 pitocin. Fetal Wellbeing:  Cat 2. CTM Pain Control:  Planning epidural Anticipated MOD:  vaginal  [redacted]w[redacted]d, MD 12/03/2021 9:23 AM

## 2021-12-04 ENCOUNTER — Inpatient Hospital Stay (HOSPITAL_COMMUNITY): Payer: Medicaid Other | Admitting: Anesthesiology

## 2021-12-04 ENCOUNTER — Other Ambulatory Visit: Payer: Self-pay

## 2021-12-04 ENCOUNTER — Encounter (HOSPITAL_COMMUNITY)
Admission: RE | Disposition: A | Discharge status: Home / Self Care | Payer: Self-pay | Source: Home / Self Care | Attending: Obstetrics and Gynecology

## 2021-12-04 ENCOUNTER — Encounter (HOSPITAL_COMMUNITY): Payer: Self-pay | Admitting: Obstetrics and Gynecology

## 2021-12-04 DIAGNOSIS — Z3A40 40 weeks gestation of pregnancy: Secondary | ICD-10-CM

## 2021-12-04 DIAGNOSIS — O480 Post-term pregnancy: Secondary | ICD-10-CM

## 2021-12-04 SURGERY — Surgical Case
Anesthesia: Epidural

## 2021-12-04 MED ORDER — FENTANYL CITRATE (PF) 100 MCG/2ML IJ SOLN
INTRAMUSCULAR | Status: AC
  Filled 2021-12-04: qty 2

## 2021-12-04 MED ORDER — ZOLPIDEM TARTRATE 5 MG PO TABS: 5.0000 mg | ORAL_TABLET | Freq: Every evening | ORAL | Status: DC | PRN

## 2021-12-04 MED ORDER — OXYCODONE HCL 5 MG PO TABS: 5.0000 mg | ORAL_TABLET | Freq: Once | ORAL | Status: DC | PRN

## 2021-12-04 MED ORDER — CEFAZOLIN SODIUM-DEXTROSE 2-4 GM/100ML-% IV SOLN
2.0000 g | INTRAVENOUS | Status: AC
  Administered 2021-12-04: 2 g via INTRAVENOUS

## 2021-12-04 MED ORDER — SIMETHICONE 80 MG PO CHEW: 80.0000 mg | CHEWABLE_TABLET | ORAL | Status: DC | PRN

## 2021-12-04 MED ORDER — DIPHENHYDRAMINE HCL 50 MG/ML IJ SOLN: 12.5000 mg | INTRAMUSCULAR | Status: DC | PRN

## 2021-12-04 MED ORDER — ONDANSETRON HCL 4 MG/2ML IJ SOLN: 4.0000 mg | Freq: Once | INTRAMUSCULAR | Status: DC | PRN

## 2021-12-04 MED ORDER — MORPHINE SULFATE (PF) 0.5 MG/ML IJ SOLN
INTRAMUSCULAR | Status: DC | PRN
  Administered 2021-12-04: 3 mg via EPIDURAL

## 2021-12-04 MED ORDER — MEPERIDINE HCL 25 MG/ML IJ SOLN: 6.2500 mg | INTRAMUSCULAR | Status: DC | PRN

## 2021-12-04 MED ORDER — DIBUCAINE (PERIANAL) 1 % EX OINT: 1.0000 | TOPICAL_OINTMENT | CUTANEOUS | Status: DC | PRN

## 2021-12-04 MED ORDER — COCONUT OIL OIL: 1.0000 | TOPICAL_OIL | Status: DC | PRN

## 2021-12-04 MED ORDER — OXYTOCIN-SODIUM CHLORIDE 30-0.9 UT/500ML-% IV SOLN
INTRAVENOUS | Status: DC | PRN
  Administered 2021-12-04: 300 mL via INTRAVENOUS

## 2021-12-04 MED ORDER — OXYCODONE HCL 5 MG PO TABS
5.0000 mg | ORAL_TABLET | ORAL | Status: DC | PRN
  Administered 2021-12-06: 5 mg via ORAL
  Filled 2021-12-04: qty 1

## 2021-12-04 MED ORDER — STERILE WATER FOR IRRIGATION IR SOLN
Status: DC | PRN
  Administered 2021-12-04: 1

## 2021-12-04 MED ORDER — ONDANSETRON HCL 4 MG/2ML IJ SOLN: 4.0000 mg | Freq: Three times a day (TID) | INTRAMUSCULAR | Status: DC | PRN

## 2021-12-04 MED ORDER — WITCH HAZEL-GLYCERIN EX PADS: 1.0000 | MEDICATED_PAD | CUTANEOUS | Status: DC | PRN

## 2021-12-04 MED ORDER — MENTHOL 3 MG MT LOZG: 1.0000 | LOZENGE | OROMUCOSAL | Status: DC | PRN

## 2021-12-04 MED ORDER — CEFAZOLIN SODIUM-DEXTROSE 2-4 GM/100ML-% IV SOLN
INTRAVENOUS | Status: AC
  Filled 2021-12-04: qty 100

## 2021-12-04 MED ORDER — METHYLERGONOVINE MALEATE 0.2 MG/ML IJ SOLN: 0.2000 mg | INTRAMUSCULAR | Status: DC | PRN

## 2021-12-04 MED ORDER — OXYTOCIN-SODIUM CHLORIDE 30-0.9 UT/500ML-% IV SOLN
2.5000 [IU]/h | INTRAVENOUS | Status: AC
  Administered 2021-12-04: 2.5 [IU]/h via INTRAVENOUS

## 2021-12-04 MED ORDER — SENNOSIDES-DOCUSATE SODIUM 8.6-50 MG PO TABS
2.0000 | ORAL_TABLET | Freq: Every day | ORAL | Status: DC
  Administered 2021-12-05 – 2021-12-06 (×2): 2 via ORAL
  Filled 2021-12-04 (×2): qty 2

## 2021-12-04 MED ORDER — METHYLERGONOVINE MALEATE 0.2 MG PO TABS: 0.2000 mg | ORAL_TABLET | ORAL | Status: DC | PRN

## 2021-12-04 MED ORDER — PHENYLEPHRINE 80 MCG/ML (10ML) SYRINGE FOR IV PUSH (FOR BLOOD PRESSURE SUPPORT)
PREFILLED_SYRINGE | INTRAVENOUS | Status: AC
  Filled 2021-12-04: qty 10

## 2021-12-04 MED ORDER — NALOXONE HCL 0.4 MG/ML IJ SOLN: 0.4000 mg | INTRAMUSCULAR | Status: DC | PRN

## 2021-12-04 MED ORDER — ENOXAPARIN SODIUM 40 MG/0.4ML IJ SOSY
40.0000 mg | PREFILLED_SYRINGE | INTRAMUSCULAR | Status: DC
  Administered 2021-12-05 – 2021-12-06 (×2): 40 mg via SUBCUTANEOUS
  Filled 2021-12-04 (×2): qty 0.4

## 2021-12-04 MED ORDER — AMISULPRIDE (ANTIEMETIC) 5 MG/2ML IV SOLN: 10.0000 mg | Freq: Once | INTRAVENOUS | Status: DC | PRN

## 2021-12-04 MED ORDER — ACETAMINOPHEN 500 MG PO TABS
1000.0000 mg | ORAL_TABLET | Freq: Four times a day (QID) | ORAL | Status: AC
  Administered 2021-12-04 – 2021-12-05 (×3): 1000 mg via ORAL
  Filled 2021-12-04 (×4): qty 2

## 2021-12-04 MED ORDER — MEASLES, MUMPS & RUBELLA VAC IJ SOLR: 0.5000 mL | Freq: Once | INTRAMUSCULAR | Status: DC

## 2021-12-04 MED ORDER — OXYCODONE HCL 5 MG/5ML PO SOLN: 5.0000 mg | Freq: Once | ORAL | Status: DC | PRN

## 2021-12-04 MED ORDER — TETANUS-DIPHTH-ACELL PERTUSSIS 5-2.5-18.5 LF-MCG/0.5 IM SUSY: 0.5000 mL | PREFILLED_SYRINGE | Freq: Once | INTRAMUSCULAR | Status: DC

## 2021-12-04 MED ORDER — HYDROMORPHONE HCL 1 MG/ML IJ SOLN
0.2500 mg | INTRAMUSCULAR | Status: DC | PRN
  Administered 2021-12-04: 0.5 mg via INTRAVENOUS

## 2021-12-04 MED ORDER — LACTATED RINGERS IV SOLN: INTRAVENOUS | Status: DC | PRN

## 2021-12-04 MED ORDER — SODIUM CHLORIDE 0.9 % IV SOLN
500.0000 mg | INTRAVENOUS | Status: AC
  Administered 2021-12-04: 500 mg via INTRAVENOUS

## 2021-12-04 MED ORDER — KETOROLAC TROMETHAMINE 30 MG/ML IJ SOLN: 30.0000 mg | Freq: Once | INTRAMUSCULAR | Status: DC | PRN

## 2021-12-04 MED ORDER — IBUPROFEN 600 MG PO TABS
600.0000 mg | ORAL_TABLET | Freq: Four times a day (QID) | ORAL | Status: DC
  Administered 2021-12-05 – 2021-12-06 (×4): 600 mg via ORAL
  Filled 2021-12-04 (×4): qty 1

## 2021-12-04 MED ORDER — HYDROMORPHONE HCL 1 MG/ML IJ SOLN
INTRAMUSCULAR | Status: AC
  Filled 2021-12-04: qty 0.5

## 2021-12-04 MED ORDER — LIDOCAINE-EPINEPHRINE (PF) 2 %-1:200000 IJ SOLN
INTRAMUSCULAR | Status: DC | PRN
  Administered 2021-12-04: 5 mL via EPIDURAL
  Administered 2021-12-04: 7 mL via EPIDURAL
  Administered 2021-12-04: 3 mL via EPIDURAL

## 2021-12-04 MED ORDER — DIPHENHYDRAMINE HCL 25 MG PO CAPS: 25.0000 mg | ORAL_CAPSULE | ORAL | Status: DC | PRN

## 2021-12-04 MED ORDER — LIDOCAINE-EPINEPHRINE (PF) 2 %-1:200000 IJ SOLN
INTRAMUSCULAR | Status: AC
  Filled 2021-12-04: qty 20

## 2021-12-04 MED ORDER — DIPHENHYDRAMINE HCL 25 MG PO CAPS: 25.0000 mg | ORAL_CAPSULE | Freq: Four times a day (QID) | ORAL | Status: DC | PRN

## 2021-12-04 MED ORDER — SOD CITRATE-CITRIC ACID 500-334 MG/5ML PO SOLN
30.0000 mL | ORAL | Status: AC
  Administered 2021-12-04: 30 mL via ORAL

## 2021-12-04 MED ORDER — SCOPOLAMINE 1 MG/3DAYS TD PT72: 1.0000 | MEDICATED_PATCH | Freq: Once | TRANSDERMAL | Status: DC

## 2021-12-04 MED ORDER — ONDANSETRON HCL 4 MG/2ML IJ SOLN
INTRAMUSCULAR | Status: DC | PRN
  Administered 2021-12-04: 4 mg via INTRAVENOUS

## 2021-12-04 MED ORDER — PRENATAL MULTIVITAMIN CH
1.0000 | ORAL_TABLET | Freq: Every day | ORAL | Status: DC
  Administered 2021-12-05 – 2021-12-06 (×2): 1 via ORAL
  Filled 2021-12-04 (×2): qty 1

## 2021-12-04 MED ORDER — OXYTOCIN-SODIUM CHLORIDE 30-0.9 UT/500ML-% IV SOLN
INTRAVENOUS | Status: AC
  Filled 2021-12-04: qty 500

## 2021-12-04 MED ORDER — KETOROLAC TROMETHAMINE 30 MG/ML IJ SOLN
30.0000 mg | Freq: Four times a day (QID) | INTRAMUSCULAR | Status: AC | PRN
  Administered 2021-12-04: 30 mg via INTRAMUSCULAR

## 2021-12-04 MED ORDER — SODIUM CHLORIDE 0.9% FLUSH: 3.0000 mL | INTRAVENOUS | Status: DC | PRN

## 2021-12-04 MED ORDER — ONDANSETRON HCL 4 MG/2ML IJ SOLN
INTRAMUSCULAR | Status: AC
  Filled 2021-12-04: qty 2

## 2021-12-04 MED ORDER — NALOXONE HCL 4 MG/10ML IJ SOLN: 1.0000 ug/kg/h | INTRAVENOUS | Status: DC | PRN

## 2021-12-04 MED ORDER — FENTANYL CITRATE (PF) 100 MCG/2ML IJ SOLN
INTRAMUSCULAR | Status: DC | PRN
  Administered 2021-12-04: 100 ug via EPIDURAL

## 2021-12-04 MED ORDER — PHENYLEPHRINE 80 MCG/ML (10ML) SYRINGE FOR IV PUSH (FOR BLOOD PRESSURE SUPPORT)
PREFILLED_SYRINGE | INTRAVENOUS | Status: DC | PRN
  Administered 2021-12-04: 160 ug via INTRAVENOUS
  Administered 2021-12-04: 80 ug via INTRAVENOUS
  Administered 2021-12-04: 160 ug via INTRAVENOUS
  Administered 2021-12-04 (×2): 80 ug via INTRAVENOUS

## 2021-12-04 MED ORDER — SODIUM CHLORIDE 0.9 % IV SOLN
INTRAVENOUS | Status: AC
  Filled 2021-12-04: qty 5

## 2021-12-04 MED ORDER — MORPHINE SULFATE (PF) 0.5 MG/ML IJ SOLN
INTRAMUSCULAR | Status: AC
  Filled 2021-12-04: qty 10

## 2021-12-04 MED ORDER — DEXAMETHASONE SODIUM PHOSPHATE 10 MG/ML IJ SOLN
INTRAMUSCULAR | Status: AC
  Filled 2021-12-04: qty 1

## 2021-12-04 MED ORDER — DEXAMETHASONE SODIUM PHOSPHATE 10 MG/ML IJ SOLN
INTRAMUSCULAR | Status: DC | PRN
  Administered 2021-12-04: 10 mg via INTRAVENOUS

## 2021-12-04 MED ORDER — KETOROLAC TROMETHAMINE 30 MG/ML IJ SOLN
30.0000 mg | Freq: Four times a day (QID) | INTRAMUSCULAR | Status: AC
  Administered 2021-12-04 – 2021-12-05 (×3): 30 mg via INTRAVENOUS
  Filled 2021-12-04 (×4): qty 1

## 2021-12-04 MED ORDER — ALBUMIN HUMAN 5 % IV SOLN: INTRAVENOUS | Status: DC | PRN

## 2021-12-04 MED ORDER — KETOROLAC TROMETHAMINE 30 MG/ML IJ SOLN: 30.0000 mg | Freq: Four times a day (QID) | INTRAMUSCULAR | Status: AC | PRN

## 2021-12-04 MED ORDER — SIMETHICONE 80 MG PO CHEW
80.0000 mg | CHEWABLE_TABLET | Freq: Three times a day (TID) | ORAL | Status: DC
  Administered 2021-12-04 – 2021-12-06 (×6): 80 mg via ORAL
  Filled 2021-12-04 (×7): qty 1

## 2021-12-04 SURGICAL SUPPLY — 36 items
ADH SKN CLS APL DERMABOND .7 (GAUZE/BANDAGES/DRESSINGS) ×1
CHLORAPREP W/TINT 26ML (MISCELLANEOUS) ×2 IMPLANT
CLAMP UMBILICAL CORD (MISCELLANEOUS) ×1 IMPLANT
CLOTH BEACON ORANGE TIMEOUT ST (SAFETY) ×1 IMPLANT
DERMABOND ADVANCED .7 DNX12 (GAUZE/BANDAGES/DRESSINGS) ×2 IMPLANT
DRSG OPSITE POSTOP 4X10 (GAUZE/BANDAGES/DRESSINGS) ×1 IMPLANT
ELECT REM PT RETURN 9FT ADLT (ELECTROSURGICAL) ×1
ELECTRODE REM PT RTRN 9FT ADLT (ELECTROSURGICAL) ×1 IMPLANT
EXTRACTOR VACUUM BELL STYLE (SUCTIONS) IMPLANT
GLOVE BIOGEL PI IND STRL 7.0 (GLOVE) ×1 IMPLANT
GLOVE BIOGEL PI IND STRL 8 (GLOVE) ×1 IMPLANT
GLOVE ECLIPSE 8.0 STRL XLNG CF (GLOVE) ×1 IMPLANT
GOWN STRL REUS W/TWL LRG LVL3 (GOWN DISPOSABLE) ×2 IMPLANT
KIT ABG SYR 3ML LUER SLIP (SYRINGE) ×1 IMPLANT
NDL HYPO 18GX1.5 BLUNT FILL (NEEDLE) ×1 IMPLANT
NDL HYPO 25X5/8 SAFETYGLIDE (NEEDLE) ×1 IMPLANT
NEEDLE HYPO 18GX1.5 BLUNT FILL (NEEDLE) ×1 IMPLANT
NEEDLE HYPO 22GX1.5 SAFETY (NEEDLE) ×1 IMPLANT
NEEDLE HYPO 25X5/8 SAFETYGLIDE (NEEDLE) ×1 IMPLANT
NS IRRIG 1000ML POUR BTL (IV SOLUTION) ×1 IMPLANT
PACK C SECTION WH (CUSTOM PROCEDURE TRAY) ×1 IMPLANT
PAD OB MATERNITY 4.3X12.25 (PERSONAL CARE ITEMS) ×1 IMPLANT
RTRCTR C-SECT PINK 25CM LRG (MISCELLANEOUS) IMPLANT
SUT CHROMIC 0 CT 1 (SUTURE) ×1 IMPLANT
SUT MNCRL 0 VIOLET CTX 36 (SUTURE) ×2 IMPLANT
SUT MONOCRYL 0 CTX 36 (SUTURE) ×2
SUT PLAIN 2 0 (SUTURE)
SUT PLAIN 2 0 XLH (SUTURE) IMPLANT
SUT PLAIN ABS 2-0 CT1 27XMFL (SUTURE) IMPLANT
SUT VIC AB 0 CTX 36 (SUTURE) ×1
SUT VIC AB 0 CTX36XBRD ANBCTRL (SUTURE) ×1 IMPLANT
SUT VIC AB 4-0 KS 27 (SUTURE) IMPLANT
SYR 20CC LL (SYRINGE) ×2 IMPLANT
TOWEL OR 17X24 6PK STRL BLUE (TOWEL DISPOSABLE) ×1 IMPLANT
TRAY FOLEY W/BAG SLVR 14FR LF (SET/KITS/TRAYS/PACK) IMPLANT
WATER STERILE IRR 1000ML POUR (IV SOLUTION) ×1 IMPLANT

## 2021-12-04 NOTE — Anesthesia Postprocedure Evaluation (Signed)
Anesthesia Post Note  Patient: Roberta Hodges  Procedure(s) Performed: CESAREAN SECTION     Patient location during evaluation: PACU Anesthesia Type: Epidural Level of consciousness: awake and alert and oriented Pain management: pain level controlled Vital Signs Assessment: post-procedure vital signs reviewed and stable Respiratory status: spontaneous breathing, nonlabored ventilation and respiratory function stable Cardiovascular status: blood pressure returned to baseline and stable Postop Assessment: no headache, no backache, patient able to bend at knees, epidural receding and no apparent nausea or vomiting Anesthetic complications: no   No notable events documented.  Last Vitals:  Vitals:   12/04/21 0633 12/04/21 0645  BP: 93/61 (!) 107/59  Pulse: 92 90  Resp: 14 16  Temp:    SpO2: 98% 99%    Last Pain:  Vitals:   12/04/21 0549  TempSrc: Oral  PainSc:    Pain Goal:    LLE Motor Response: Purposeful movement (12/04/21 0635) LLE Sensation: Tingling (12/04/21 9379) RLE Motor Response: Purposeful movement (12/04/21 0240) RLE Sensation: Tingling (12/04/21 9735)     Epidural/Spinal Function Cutaneous sensation: Able to Wiggle Toes (12/04/21 3299), Patient able to flex knees: No (12/04/21 2426), Patient able to lift hips off bed: No (12/04/21 0635), Back pain beyond tenderness at insertion site: No (12/04/21 0635), Progressively worsening motor and/or sensory loss: No (12/04/21 8341), Bowel and/or bladder incontinence post epidural: No (12/04/21 9622)  Lannie Fields

## 2021-12-04 NOTE — Op Note (Signed)
Preoperative diagnosis:  1.  Intrauterine pregnancy at [redacted]w[redacted]d weeks gestation                                         2.  Fetal intolerance of labor                                         3.  Post dates pregnancy   Postoperative diagnosis:  Same as above   Procedure:  Primary cesarean section  Surgeon:  Lazaro Arms MD  Assistant:    Anesthesia: Spinal  Findings:  .    Over a low transverse incision was delivered a viable female at 73 with Apgars of 9 and 9 weighing 7 lbs. 13 oz. Uterus, tubes and ovaries were all normal.  There were no other significant findings  Description of operation:  Patient was taken to the operating room and had her epiodural adequately dosed. She was then placed in the supine position with tilt to the left side. When adequate anesthetic level was obtained she was prepped and draped in usual sterile fashion and a Foley catheter was placed. A Pfannenstiel skin incision was made and carried down sharply to the rectus fascia which was scored in the midline extended laterally. The fascia was taken off the muscles both superiorly and without difficulty. The muscles were divided.  The peritoneal cavity was entered.  Bladder blade was placed, no bladder flap was created.   An Alexis self retaining retractor was placed.  A low transverse hysterotomy incision was made and delivered a viable female  infant at 35 with Apgars of 9 and 9 weighing7 lbs 13 oz.  Venous Cord pH was obtained and was pending. The uterus was exteriorized. It was closed in 2 layers, the first being a running interlocking layer and the second being an imbricating layer using 0 monocryl on a CTX needle. There was good resulting hemostasis. The uterus tubes and ovaries were all normal. Peritoneal cavity was irrigated vigorously. The muscles and peritoneum were reapproximated loosely. The fascia was closed using 0 Vicryl in running fashion. Subcutaneous tissue was made hemostatic and irrigated. The skin was  closed using 4-0 Vicryl on a Keith needle in a subcuticular fashion.  Dermabond was placed for additional wound integrity and to serve as a barrier. Blood loss for the procedure was 963 cc. The patient received a 2 gram of Ancef and 1 gram azithromycin prophylactically. The patient was taken to the recovery room in good stable condition with all counts being correct x3.  EBL 963 cc  Lazaro Arms 12/04/2021 5:30 AM

## 2021-12-04 NOTE — Progress Notes (Signed)
Roberta Hodges is a 26 y.o. G1P0 at [redacted]w[redacted]d by ultrasound admitted for induction of labor due to Post dates. Due date 11/28/21.  Subjective:   Objective: BP (!) 91/52   Pulse 75   Temp 98.1 F (36.7 C) (Axillary)   Resp 15   Ht 4\' 11"  (1.499 m)   Wt 82.9 kg   LMP 02/21/2021 (Exact Date)   BMI 36.92 kg/m  No intake/output data recorded. Total I/O In: -  Out: 2325 [Urine:2325]  FHT:  pt has reactive strip otherwise but had a nother prolonged fetal bradycardic episode when reached adequate labor, pt opts for caesarean section UC:    SVE:   Dilation: 6 Effacement (%): 80 Station: -2 Exam by:: [email or web address], RN  Labs: Lab Results  Component Value Date   WBC 8.5 12/02/2021   HGB 12.3 12/02/2021   HCT 36.5 12/02/2021   MCV 86.7 12/02/2021   PLT 311 12/02/2021    Assessment / Plan: 2nd fetal bradycardic episode, fetal intolerance of labor  Primary c section consented  13/10/2021, MD 12/04/2021, 4:21 AM

## 2021-12-04 NOTE — Progress Notes (Signed)
Patient ID: Roberta Hodges, female   DOB: [birth year], 27 y.o.   MRN: [id number]   Subjective: -Nurse call and reports patient with deceleration.  Provider to bedside and patient requesting cesarean section.   Objective: BP (!) 91/52   Pulse 75   Temp 98.1 F (36.7 C) (Axillary)   Resp 15   Ht 4\' 11"  (1.499 m)   Wt 82.9 kg   LMP 02/21/2021 (Exact Date)   BMI 36.92 kg/m  No intake/output data recorded. Total I/O In: -  Out: 2100 [Urine:2100]  Fetal Monitoring: FHT: 145 bpm, Mod Var, -Decels, +Accels UC: Q2 min  Vaginal Exam: SVE:   Dilation: 6 Effacement (%): 80 Station: -2 Exam by:: [email or web address], RN Membranes:AROM x 30 hrs Internal Monitors: IUPC  Augmentation/Induction: Pitocin:Discontinued from 2mUn/min Cytotec: S/P  Assessment:  IUP at 40.6 weeks Cat II FT  IOL s/t PD   Plan: -Patient clarifies, via interpreter, that she does in fact desire a primary c/s. -Patient states she has been laboring for a long time and does not desire to continue induction process.  -Interpretations completed with assistance of video interpreter. -Dr. 16m notified of patient desire and will come to bedside.    Despina Hidden, CNM Advanced Practice Provider, Center for [facility] 12/04/2021, 4:15 AM

## 2021-12-04 NOTE — Anesthesia Postprocedure Evaluation (Signed)
Anesthesia Post Note  Patient: Roberta Hodges  Procedure(s) Performed: AN AD HOC EPIDURAL     Patient location during evaluation: Mother Baby Anesthesia Type: Epidural Level of consciousness: awake and alert Pain management: pain level controlled Vital Signs Assessment: post-procedure vital signs reviewed and stable Respiratory status: spontaneous breathing, nonlabored ventilation and respiratory function stable Cardiovascular status: stable Postop Assessment: no headache, no backache and epidural receding Anesthetic complications: no   No notable events documented.  Last Vitals:  Vitals:   12/04/21 1210 12/04/21 1415  BP: 114/67   Pulse: 73   Resp: 16 16  Temp: 36.7 C 36.8 C  SpO2: 98% 98%    Last Pain:  Vitals:   12/04/21 1505  TempSrc:   PainSc: 0-No pain   Pain Goal: Patients Stated Pain Goal: 3 (12/04/21 1415)                 Jennye Moccasin, Weldon Picking

## 2021-12-04 NOTE — Progress Notes (Signed)
Patient ID: Roberta Hodges, female   DOB: 1995-05-19, 26 y.o.   MRN: [id number]  Subjective: -Patient resting in bed.  Reports good fetal movement and no pain.  Denies rectal pressure.  Her husband, Delton See, is at the bedside supportive.   Objective: BP (!) 99/49   Pulse 88   Temp 97.8 F (36.6 C) (Oral)   Resp 17   Ht 4\' 11"  (1.499 m)   Wt 82.9 kg   LMP 02/21/2021 (Exact Date)   BMI 36.92 kg/m  No intake/output data recorded. Total I/O In: -  Out: 1475 [Urine:1475]  Fetal Monitoring: FHT: 145 bpm, Mod Var, -Decels, +Accels UC: Q3-52min, MVUs 30-7mmHg    Vaginal Exam: SVE:   Dilation: 6 Effacement (%): 80 Station: -2 Exam by:: [email or web address], RN Membranes:AROM x 26hrs Internal Monitors: IUPC  Augmentation/Induction: Pitocin:58mUn/min Cytotec: S/P 1 Dose  Assessment:  IUP at 40.6 weeks Cat I FT  IOL PD aFebrile   Plan: -Pitocin titration until MVUs adequate. -Plan to recheck cervix once adequate. -Position change as appropriate. -Continue other mgmt as ordered -Interpretations completed with assistance of video interpreter: 11m Thomes Lolling.   [id number], CNM Advanced Practice Provider, Center for [facility] 12/04/2021, 12:48 AM

## 2021-12-04 NOTE — Lactation Note (Signed)
This note was copied from a baby's chart. Lactation Consultation Note  Patient Name: Roberta Hodges [id number] Date: 12/04/2021 Reason for consult: Initial assessment;Primapara;1st time breastfeeding;Term Age:27 hours  Consult was done in Spanish: Initial visit to 27 hours old infant. LC assisted with feeding. Demonstrated hand expression, collected ~1-mL of colostrum. Provided hand pump for colostrum expression and nipple eversion, collected ~3-mL and spoonfed a total of 4-mL. Infant successfully latched for ~10 minutes to R-breast, cradle position and ~5 minutes to L-breast. Encouraged C-hold instead of scissors hold.  Discussed normal newborn behavior and patterns, signs of good milk transfer, hunger cues, tummy size and benefits of skin to skin. Talked about milk transition in 3-5 days.    Plan: 1-Breastfeeding on demand or 8-12 times in 24h period. 2-Use manual pump as needed for nipple eversion 3-Encouraged birthing parent rest, hydration and food intake.   Contact LC as needed for feeds/support/concerns/questions. All questions answered at this time. Provided Lactation services brochure and promoted [employer] booklet information.    Maternal Data Has patient been taught Hand Expression?: Yes Does the patient have breastfeeding experience prior to this delivery?: No  Feeding Mother's Current Feeding Choice: Breast Milk  LATCH Score Latch: Grasps breast easily, tongue down, lips flanged, rhythmical sucking.  Audible Swallowing: Spontaneous and intermittent  Type of Nipple: Everted at rest and after stimulation (short shafted, better after pre-pumping)  Comfort (Breast/Nipple): Soft / non-tender  Hold (Positioning): Assistance needed to correctly position infant at breast and maintain latch.  LATCH Score: 9   Lactation Tools Discussed/Used Tools: Pump;Flanges Flange Size: 24;21 (may use a 21-mm once swelling improves) Breast pump type: Manual Pump Education: Setup,  frequency, and cleaning;Milk Storage Reason for Pumping: nipple eversion Pumping frequency: prior to feedings Pumped volume: 3 mL (with demonstration)  Interventions Interventions: Breast feeding basics reviewed;Assisted with latch;Skin to skin;Breast massage;Hand express;Pre-pump if needed;Breast compression;Adjust position;Support pillows;Position options;Expressed milk;Hand pump;Education;LC Services brochure  Discharge Pump: Manual WIC Program: Yes  Consult Status Consult Status: Follow-up Date: 12/05/21 Follow-up type: In-patient   [facility] student -Jon Gills  Oluwadarasimi Redmon A Higuera Ancidey 12/04/2021, 1:23 PM

## 2021-12-04 NOTE — Transfer of Care (Signed)
Immediate Anesthesia Transfer of Care Note  Patient: Roberta Hodges  Procedure(s) Performed: CESAREAN SECTION  Patient Location: PACU  Anesthesia Type:Spinal  Level of Consciousness: awake, alert , and oriented  Airway & Oxygen Therapy: Patient Spontanous Breathing  Post-op Assessment: Report given to RN and Post -op Vital signs reviewed and stable  Post vital signs: Reviewed and stable  Last Vitals:  Vitals Value Taken Time  BP 129/110 12/04/21 0548  Temp    Pulse 95 12/04/21 0553  Resp 12 12/04/21 0553  SpO2 99 % 12/04/21 0553  Vitals shown include unvalidated device data.  Last Pain:  Vitals:   12/04/21 0340  TempSrc: Axillary  PainSc: 0-No pain         Complications: No notable events documented.

## 2021-12-05 LAB — CBC
HCT: 21.4 % — ABNORMAL LOW (ref 36.0–46.0)
Hemoglobin: 7.4 g/dL — ABNORMAL LOW (ref 12.0–15.0)
MCH: 30 pg (ref 26.0–34.0)
MCHC: 34.6 g/dL (ref 30.0–36.0)
MCV: 86.6 fL (ref 80.0–100.0)
Platelets: 211 10*3/uL (ref 150–400)
RBC: 2.47 MIL/uL — ABNORMAL LOW (ref 3.87–5.11)
RDW: 14.5 % (ref 11.5–15.5)
WBC: 15.8 10*3/uL — ABNORMAL HIGH (ref 4.0–10.5)
nRBC: 0 % (ref 0.0–0.2)

## 2021-12-05 MED ORDER — ACETAMINOPHEN 325 MG PO TABS
650.0000 mg | ORAL_TABLET | Freq: Four times a day (QID) | ORAL | Status: DC
  Administered 2021-12-05 – 2021-12-06 (×5): 650 mg via ORAL
  Filled 2021-12-05 (×5): qty 2

## 2021-12-05 MED ORDER — SODIUM CHLORIDE 0.9 % IV SOLN
500.0000 mg | Freq: Once | INTRAVENOUS | Status: AC
  Administered 2021-12-05: 500 mg via INTRAVENOUS
  Filled 2021-12-05: qty 500

## 2021-12-05 NOTE — Lactation Note (Addendum)
This note was copied from a baby's chart. Lactation Consultation Note  Patient Name: Roberta Hodges [id number] Date: 12/05/2021 Reason for consult: Follow-up assessment;Nipple pain/trauma Age:27 hours  Spanish interpreter used via video. P1, Mother receiving blood transfusion. Mother's nipples are cracked with abrasions on tips. Mother is currently pumping with hand pump and not latching baby due to soreness.  Mother is giving formula. Provided comfort gels for after pumping/latching. Suggest alternating with coconut oil and lubricating flanges when pumping. Set up DEBP with 24 flanges which seem appropriate fit at time but instructed mother to move up on size if it becomes too sore.  Recommend pumping q 3 hours. Provided uncut belly band to use with comfort gels since mother did not bring a bra.   Feeding Mother's Current Feeding Choice: Breast Milk and Formula   Lactation Tools Discussed/Used Tools: Pump Flange Size: 24 Breast pump type: Double-Electric Breast Pump;Manual Pump Education: Setup, frequency, and cleaning;Milk Storage Reason for Pumping: stimulation and soreness Pumping frequency:  (q 3 hours)  Interventions Interventions: DEBP;Education;Coconut oil;Comfort gels  Discharge Pump: Stork Pump (paperwork sent)  Consult Status Consult Status: Follow-up Date: 12/06/21 Follow-up type: In-patient    Dahlia Byes [facility] 12/05/2021, 2:36 PM

## 2021-12-05 NOTE — Progress Notes (Signed)
POSTPARTUM PROGRESS NOTE  POD #1  Subjective:  Roberta Hodges is a 26 y.o. G1P1001 s/p pLTCS at [redacted]w[redacted]d.  She reports she doing well. No acute events overnight. She reports she is doing well. She denies any problems with ambulating, voiding or po intake. Denies nausea or vomiting. She has passed flatus. Pain is well controlled.  Lochia is minimal.  Objective: Blood pressure (!) 100/57, pulse 78, temperature 98 F (36.7 C), temperature source Oral, resp. rate 17, height 4\' 11"  (1.499 m), weight 82.9 kg, last menstrual period 02/21/2021, SpO2 100 %, unknown if currently breastfeeding.  Physical Exam:  General: alert, cooperative and no distress Chest: no respiratory distress Heart:regular rate, distal pulses intact Abdomen: soft, nontender,  Uterine Fundus: firm, appropriately tender DVT Evaluation: No calf swelling or tenderness Extremities: trace edema Skin: warm, dry; incision clean/dry/intact w/ honeycomb dressing in place  Recent Labs    12/05/21 0443  HGB 7.4*  HCT 21.4*    Assessment/Plan: Roberta Hodges is a 26 y.o. G1P1001 s/p pLTCS at [redacted]w[redacted]d for NRFHT.  POD#1 - Doing welll; pain controlled. H/H appropriate  Routine postpartum care  OOB, ambulated  Lovenox for VTE prophylaxis  Acute blood loss Anemia: asymptomatic, Hgb 7.4.  IV iron sucrose ordered.  Will continue with PO iron on discharge  Contraception: plans for depo Feeding: breast/bottle  Dispo: Plan for discharge in 1-2 days.   LOS: 3 days   [redacted]w[redacted]d MD MPH OB Fellow, Faculty Practice [facility], Center for [facility] 12/05/2021

## 2021-12-06 ENCOUNTER — Other Ambulatory Visit (HOSPITAL_COMMUNITY): Payer: Self-pay

## 2021-12-06 LAB — SURGICAL PATHOLOGY

## 2021-12-06 MED ORDER — ACETAMINOPHEN 325 MG PO TABS
650.0000 mg | ORAL_TABLET | Freq: Four times a day (QID) | ORAL | 0 refills | Status: DC
  Filled 2021-12-06: qty 30, 4d supply, fill #0

## 2021-12-06 MED ORDER — IBUPROFEN 600 MG PO TABS
600.0000 mg | ORAL_TABLET | Freq: Four times a day (QID) | ORAL | 0 refills | Status: DC
  Filled 2021-12-06: qty 30, 8d supply, fill #0

## 2021-12-06 MED ORDER — MEDROXYPROGESTERONE ACETATE 150 MG/ML IM SUSP
150.0000 mg | Freq: Once | INTRAMUSCULAR | Status: DC
  Filled 2021-12-06: qty 1

## 2021-12-06 MED ORDER — OXYCODONE HCL 5 MG PO TABS
5.0000 mg | ORAL_TABLET | ORAL | 0 refills | Status: DC | PRN
  Filled 2021-12-06: qty 30, 3d supply, fill #0

## 2021-12-06 NOTE — Discharge Summary (Signed)
Entire encounter performed in presence of interpreter: Royetta Car #[id number]     Postpartum Discharge Summary     Patient Name: Roberta Hodges DOB: [birth year] MRN: [id number]  Date of admission: 12/02/2021 Delivery date:12/04/2021  Delivering provider: Florian Buff  Date of discharge: 12/06/2021  Admitting diagnosis: Encounter for elective induction of labor [Z34.90] Intrauterine pregnancy: [redacted]w[redacted]d    Secondary diagnosis:  Principal Problem:   Encounter for elective induction of labor  Additional problems: Anemia    Discharge diagnosis: Term Pregnancy Delivered and Anemia                                              Post partum procedures: IV venofer x1 Augmentation: AROM, Pitocin, and Cytotec Complications: None  Hospital course: Induction of Labor With Cesarean Section   26y.o. yo G1P1001 at 457w6das admitted to the hospital 12/02/2021 for induction of labor. Patient had a labor course significant for arrest of dilation. The patient went for cesarean section due to Arrest of Dilation and Non-Reassuring FHR. Delivery details are as follows: Membrane Rupture Time/Date: 10:23 PM ,12/02/2021   Delivery Method:C-Section, Low Transverse  Details of operation can be found in separate operative Note.  Patient had a postpartum course complicated by anemia in which she received venofer x1. She is ambulating, tolerating a regular diet, passing flatus, and urinating well.  Patient is discharged home in stable condition on 12/06/21.      Newborn Data: Birth date:12/04/2021  Birth time:4:58 AM  Gender:Female  Living status:Living  Apgars:9 ,9  Weight:3550 g                                Magnesium Sulfate received: No BMZ received: No Rhophylac:No MMR:No T-DaP:Given prenatally Flu: Given prenatally Transfusion:Yes IV venofer  Physical exam  Vitals:   12/04/21 2124 12/05/21 0240 12/05/21 1940 12/06/21 0535  BP: (!) 107/53 (!) 100/57 106/62 112/76  Pulse: 84 78 81 73  Resp:  _0 Temp: 98 F (36.7 C) 98 F (36.7 C) 98.2 F (36.8 C) 98 F (36.7 C)  TempSrc: Oral Oral Oral Oral  SpO2: 100% 100% 100% 99%  Weight:      Height:       General: alert, cooperative, and no distress Lochia: appropriate Uterine Fundus: firm Incision: Healing well with no significant drainage DVT Evaluation: No evidence of DVT seen on physical exam. Labs: Lab Results  Component Value Date   WBC 15.8 (H) 12/05/2021   HGB 7.4 (L) 12/05/2021   HCT 21.4 (L) 12/05/2021   MCV 86.6 12/05/2021   PLT 211 12/05/2021      Latest Ref Rng & Units 06/17/2021   10:57 AM  CMP  Glucose 70 - 99 mg/dL 80   BUN 6 - 20 mg/dL 6   Creatinine 0.57 - 1.00 mg/dL 0.51   Sodium 134 - 144 mmol/L 136   Potassium 3.5 - 5.2 mmol/L 4.4   Chloride 96 - 106 mmol/L 100   CO2 20 - 29 mmol/L 21   Calcium 8.7 - 10.2 mg/dL 9.4   Total Protein 6.0 - 8.5 g/dL 7.1   Total Bilirubin 0.0 - 1.2 mg/dL 0.2   Alkaline Phos 44 - 121 IU/L 51   AST 0 - 40 IU/L 16   ALT 0 -  32 IU/L 9    Edinburgh Score:    12/04/2021    4:15 PM  Edinburgh Postnatal Depression Scale Screening Tool  I have been able to laugh and see the funny side of things. 0  I have looked forward with enjoyment to things. 1  I have blamed myself unnecessarily when things went wrong. 0  I have been anxious or worried for no good reason. 1  I have felt scared or panicky for no good reason. 1  Things have been getting on top of me. 1  I have been so unhappy that I have had difficulty sleeping. 0  I have felt sad or miserable. 0  I have been so unhappy that I have been crying. 0  The thought of harming myself has occurred to me. 0  Edinburgh Postnatal Depression Scale Total 4     After visit meds:  Allergies as of 12/06/2021   No Known Allergies      Medication List     TAKE these medications    acetaminophen 325 MG tablet Commonly known as: TYLENOL Take 2 tablets (650 mg total) by mouth every 6 (six) hours.   Blood  Pressure Kit Devi 1 Device by Does not apply route as needed.   Gojji Weight Scale Misc 1 Device by Does not apply route as needed.   ibuprofen 600 MG tablet Commonly known as: ADVIL Take 1 tablet (600 mg total) by mouth every 6 (six) hours.   oxyCODONE 5 MG immediate release tablet Commonly known as: Oxy IR/ROXICODONE Take 1-2 tablets (5-10 mg total) by mouth every 4 (four) hours as needed for moderate pain.   PRENATAL PO Take by mouth.         Discharge home in stable condition Infant Feeding: Bottle and Breast Infant Disposition:home with mother Discharge instruction: per After Visit Summary and Postpartum booklet. Activity: Advance as tolerated. Pelvic rest for 6 weeks.  Diet: routine diet Future Appointments:No future appointments. Follow up Visit:  Message sent to [facility] by Autry-Lott on 12/06/2021  Please schedule this patient for a In person postpartum visit in 6 weeks with the following provider: MD and APP. Additional Postpartum F/U:Incision check 1 week  Low risk pregnancy complicated by:  false pos Hep C Delivery mode:  C-Section, Low Transverse  Anticipated Birth Control:  Depo   12/06/2021 Naaman Plummer Autry-Lott, DO

## 2021-12-06 NOTE — Lactation Note (Signed)
This note was copied from a baby's chart. Lactation Consultation Note  Patient Name: Roberta Hodges [id number] Date: 12/06/2021 Reason for consult: Follow-up assessment;Nipple pain/trauma Age:27 hours  Spanish interpreter used via video. Mother has cracked nipples with abrasions. Mother is wearing comfort gels for soreness with belly band. Provided mother with shells to wear. For soreness suggest mother apply ebm or coconut oil while wearing shells and alternate with comfort gels. Mother request nipple shield.  Provided mother with 20&24 nipple shield and reviewed how to use. Suggest mother call if she is able to tolerate breastfeeding and would like to try with nipple shield. Reviewed engorgement care and monitoring voids/stools.   Feeding Mother's Current Feeding Choice: Breast Milk and Formula Nipple Type: Slow - flow   Lactation Tools Discussed/Used Tools: Pump;Coconut oil;Comfort gels;Shells;Nipple Shields Nipple shield size: 20;24 Flange Size: 24 Breast pump type: Double-Electric Breast Pump Pump Education: Milk Storage Reason for Pumping: soreness Pumping frequency: q 3 hours  Interventions Interventions: Comfort gels;Coconut oil;Shells;Education  Discharge Pump: Stork Pump (given)  Consult Status Consult Status: Complete Date: 12/06/21    Dahlia Byes [facility] 12/06/2021, 11:12 AM

## 2021-12-13 ENCOUNTER — Ambulatory Visit (INDEPENDENT_AMBULATORY_CARE_PROVIDER_SITE_OTHER): Payer: Medicaid Other | Admitting: General Practice

## 2021-12-13 VITALS — BP 121/73 | HR 90 | Ht 59.06 in | Wt 167.0 lb

## 2021-12-13 DIAGNOSIS — Z5189 Encounter for other specified aftercare: Secondary | ICD-10-CM

## 2021-12-13 NOTE — Progress Notes (Signed)
Patient presents to office today for wound check following up from primary c-section on 11/12. She reports doing well since then. Honeycomb dressing was in still in place. Dressing removed. Incision appears to be healing well and is clean/dry. Reviewed wound care with patient and signs & symptoms of infection. Patient will follow up at pp visit.   Chase Caller RN BSN 12/13/21

## 2022-01-11 ENCOUNTER — Other Ambulatory Visit: Payer: Self-pay

## 2022-01-11 ENCOUNTER — Ambulatory Visit (INDEPENDENT_AMBULATORY_CARE_PROVIDER_SITE_OTHER): Payer: Medicaid Other | Admitting: Obstetrics and Gynecology

## 2022-01-11 ENCOUNTER — Encounter: Payer: Self-pay | Admitting: Obstetrics and Gynecology

## 2022-01-11 NOTE — Progress Notes (Signed)
Post Partum Visit Note  Roberta Hodges is a 26 y.o. G77P1001 female who presents for a postpartum visit. She is 5 weeks postpartum following a primary cesarean section.  I have fully reviewed the prenatal and intrapartum course. The delivery was at [redacted]w[redacted]d gestational weeks.  Anesthesia: epidural. Postpartum course has been uncomplicated. Baby is doing well. Baby is feeding by both breast and bottle - Enfamil . Bleeding staining only. Bowel function is normal. Bladder function is normal. Patient is not sexually active. Contraception method is Depo-Provera injections. Postpartum depression screening: negative.   The pregnancy intention screening data noted above was reviewed. Potential methods of contraception were discussed. The patient elected to proceed with depo provera   Edinburgh Postnatal Depression Scale - 01/11/22 1049       Edinburgh Postnatal Depression Scale:  In the Past 7 Days   I have been able to laugh and see the funny side of things. 0    I have looked forward with enjoyment to things. 0    I have blamed myself unnecessarily when things went wrong. 0    I have been anxious or worried for no good reason. 0    I have felt scared or panicky for no good reason. 0    Things have been getting on top of me. 0    I have been so unhappy that I have had difficulty sleeping. 0    I have felt sad or miserable. 0    I have been so unhappy that I have been crying. 0    The thought of harming myself has occurred to me. 0    Edinburgh Postnatal Depression Scale Total 0             Health Maintenance Due  Topic Date Due   COVID-19 Vaccine (1) Never done    The following portions of the patient's history were reviewed and updated as appropriate: allergies, current medications, past family history, past medical history, past social history, past surgical history, and problem list.  Review of Systems Pertinent items are noted in HPI.  Objective:  BP 123/68   Pulse 79    Ht 4' 11.06" (1.5 m)   Wt 170 lb 12.8 oz (77.5 kg)   LMP 02/21/2021 (Exact Date)   Breastfeeding Yes   BMI 34.43 kg/m    General:  alert, cooperative, and no distress   Breasts:  not indicated  Lungs: clear to auscultation bilaterally  Heart:  regular rate and rhythm  Abdomen: soft, non-tender; bowel sounds normal; no masses,  no organomegaly   Wound well approximated incision, wound clean dry and intact  GU exam:  not indicated       Assessment:     normal postpartum exam.   Plan:   Essential components of care per ACOG recommendations:  1.  Mood and well being: Patient with negative depression screening today. Reviewed local resources for support.  - Patient tobacco use? No.   - hx of drug use? No.    2. Infant care and feeding:  -Patient currently breastmilk feeding? Yes. Reviewed importance of draining breast regularly to support lactation.  -Social determinants of health (SDOH) reviewed in EPIC. No concerns.  3. Sexuality, contraception and birth spacing - Patient does not want a pregnancy in the next year.  Desired family size is 2 children.  - Reviewed reproductive life planning. Reviewed contraceptive methods based on pt preferences and effectiveness.  Patient desired Hormonal Injection today.   - Discussed  birth spacing of 18 months  4. Sleep and fatigue -Encouraged family/partner/community support of 4 hrs of uninterrupted sleep to help with mood and fatigue  5. Physical Recovery  - Discussed patients delivery and complications. She describes her labor as mixed. - Patient had a C-section, no problems at delivery. Perineal healing reviewed. Patient expressed understanding - Patient has urinary incontinence? No. - Patient is safe to resume physical and sexual activity  6.  Health Maintenance - HM due items addressed Yes - Last pap smear  Diagnosis  Date Value Ref Range Status  07/14/2021   Final   - Negative for intraepithelial lesion or malignancy (NILM)    Pap smear not done at today's visit.  -Breast Cancer screening indicated? No.   7. Chronic Disease/Pregnancy Condition follow up: None  - PCP follow up  Griffin Basil, [place] for [facility], [place]

## 2022-01-31 ENCOUNTER — Ambulatory Visit (INDEPENDENT_AMBULATORY_CARE_PROVIDER_SITE_OTHER): Payer: Medicaid Other

## 2022-01-31 VITALS — BP 116/72 | HR 75 | Wt 167.2 lb

## 2022-01-31 DIAGNOSIS — Z3042 Encounter for surveillance of injectable contraceptive: Secondary | ICD-10-CM | POA: Diagnosis not present

## 2022-01-31 LAB — POCT PREGNANCY, URINE: Preg Test, Ur: NEGATIVE

## 2022-01-31 MED ORDER — MEDROXYPROGESTERONE ACETATE 150 MG/ML IM SUSP
150.0000 mg | Freq: Once | INTRAMUSCULAR | Status: AC
  Administered 2022-01-31: 150 mg via INTRAMUSCULAR

## 2022-01-31 NOTE — Progress Notes (Signed)
Roberta Hodges here for initial Depo-Provera Injection. Patient denies unprotected intercourse since delivery. UPT today negative. Injection administered without complication. Patient will return in 3 months for next injection between 04/19/22 and 05/03/22. Next annual visit due December 2024.   Spanish interpreter Rosemarie Ax present for encounter.  Annabell Howells, RN 01/31/2022  10:59 AM

## 2022-04-19 ENCOUNTER — Other Ambulatory Visit: Payer: Self-pay

## 2022-04-19 ENCOUNTER — Ambulatory Visit (INDEPENDENT_AMBULATORY_CARE_PROVIDER_SITE_OTHER): Payer: Medicaid Other | Admitting: General Practice

## 2022-04-19 VITALS — BP 137/82 | HR 85 | Ht 59.06 in | Wt 162.0 lb

## 2022-04-19 DIAGNOSIS — Z3042 Encounter for surveillance of injectable contraceptive: Secondary | ICD-10-CM

## 2022-04-19 MED ORDER — MEDROXYPROGESTERONE ACETATE 150 MG/ML IM SUSP
150.0000 mg | Freq: Once | INTRAMUSCULAR | Status: AC
  Administered 2022-04-19: 150 mg via INTRAMUSCULAR

## 2022-04-19 NOTE — Progress Notes (Signed)
Alafaya here for Depo-Provera Injection. Injection administered without complication. Patient will return in 3 months for next injection between 6/12 and 6/26. Next annual visit due December 2024.   Derinda Late, RN 04/19/2022  3:11 PM

## 2022-07-05 ENCOUNTER — Ambulatory Visit (INDEPENDENT_AMBULATORY_CARE_PROVIDER_SITE_OTHER): Payer: Medicaid Other

## 2022-07-05 VITALS — BP 126/78 | HR 70 | Ht 59.0 in | Wt 158.6 lb

## 2022-07-05 DIAGNOSIS — Z3042 Encounter for surveillance of injectable contraceptive: Secondary | ICD-10-CM

## 2022-07-05 MED ORDER — MEDROXYPROGESTERONE ACETATE 150 MG/ML IM SUSY
150.0000 mg | PREFILLED_SYRINGE | Freq: Once | INTRAMUSCULAR | Status: AC
  Administered 2022-07-05: 150 mg via INTRAMUSCULAR

## 2022-07-05 NOTE — Progress Notes (Signed)
Roberta Hodges Hey here for Depo-Provera Injection. Injection administered without complication. Patient will return in 3 months for next injection between Aug 28 and Sept 11. Next annual visit due now.  Patient advised to schedule annual appt with front office.  Pt verbalized understanding.  Ralene Bathe, RN 07/05/2022  3:04 PM

## 2022-08-15 ENCOUNTER — Ambulatory Visit (INDEPENDENT_AMBULATORY_CARE_PROVIDER_SITE_OTHER): Payer: Medicaid Other | Admitting: Obstetrics and Gynecology

## 2022-08-15 ENCOUNTER — Other Ambulatory Visit: Payer: Self-pay

## 2022-08-15 ENCOUNTER — Encounter: Payer: Self-pay | Admitting: Obstetrics and Gynecology

## 2022-08-15 VITALS — BP 115/67 | HR 73 | Ht 59.0 in | Wt 157.0 lb

## 2022-08-15 DIAGNOSIS — Z01419 Encounter for gynecological examination (general) (routine) without abnormal findings: Secondary | ICD-10-CM

## 2022-08-15 DIAGNOSIS — M25531 Pain in right wrist: Secondary | ICD-10-CM

## 2022-08-15 NOTE — Progress Notes (Signed)
   ANNUAL EXAM Patient name: Roberta Hodges MRN [id number]  Date of birth: 1995/07/15 Chief Complaint:   Gynecologic Exam  History of Present Illness:   Roberta Hodges is a 27 y.o. G17P1001 female being seen today for a routine annual exam.   Current complaints: Bloating, not losing weight. Had conversation previously about IUD. Is considering this.   Current birth control: Depo - last given in June.   No LMP recorded. Patient has had an injection.   Last Pap/Pap History: 06/2021. Results were: NILM w/ HRHPV negative. H/O abnormal pap: no   Health Maintenance Due  Topic Date Due   COVID-19 Vaccine (1 - 2023-24 season) Never done    Review of Systems:   Pertinent items are noted in HPI Denies any headaches, blurred vision, fatigue, shortness of breath, chest pain, abdominal pain, abnormal vaginal discharge/itching/odor/irritation, problems with periods, bowel movements, urination, or intercourse unless otherwise stated above.  Pertinent History Reviewed:  Reviewed past medical,surgical, social and family history.  Reviewed problem list, medications and allergies. Physical Assessment:   Vitals:   08/15/22 1058  BP: 115/67  Pulse: 73  Weight: 157 lb (71.2 kg)  Height: 4\' 11"  (1.499 m)  Body mass index is 31.71 kg/m.   Physical Examination:  General appearance - well appearing, and in no distress Mental status - alert, oriented to person, place, and time Psych:  She has a normal mood and affect Skin - warm and dry, normal color, no suspicious lesions noted Chest - effort normal Heart - normal rate  Breasts - breasts appear normal, no suspicious masses, no skin or nipple changes or axillary nodes Abdomen - soft, nontender, nondistended, no masses or organomegaly Pelvic -  VULVA: normal appearing vulva with no masses, tenderness or lesions  VAGINA: normal appearing vagina with normal color and discharge, no lesions  CERVIX: normal appearing cervix without  discharge or lesions, no CMT UTERUS: uterus is felt to be normal size, shape, consistency and nontender  ADNEXA: No adnexal masses or tenderness noted. Extremities:  No swelling or varicosities noted  Chaperone present for exam  No results found for this or any previous visit (from the past 24 hour(s)).  Assessment & Plan:  Muranda was seen today for gynecologic exam.  Diagnoses and all orders for this visit:  Encounter for annual routine gynecological examination - Cervical cancer screening: Discussed screening Q3 years. Reviewed importance of annual exams and limits of pap smear. Pap and HPV neg 06/2021.  - GC/CT: Discussed and recommended. Pt  declines - Gardasil: has not yet had. Will provide information - Birth Control: Depo but she would like Cu-IUD following our discussion of birth control options. Offered insertion today - she would like to do it in September. Discussed ibuprofen before appt.  - Breast Health: Encouraged self breast awareness/exams. Teaching provided. - Follow-up: 12 months and prn  Right wrist pain -     Ambulatory referral to Orthopedics       Orders Placed This Encounter  Procedures   Ambulatory referral to Orthopedics    Meds: No orders of the defined types were placed in this encounter.   Follow-up: No follow-ups on file.  Milas Hock, MD 08/15/2022 11:19 AM

## 2022-09-11 ENCOUNTER — Ambulatory Visit (INDEPENDENT_AMBULATORY_CARE_PROVIDER_SITE_OTHER): Payer: Medicaid Other | Admitting: Orthopedic Surgery

## 2022-09-11 ENCOUNTER — Other Ambulatory Visit (INDEPENDENT_AMBULATORY_CARE_PROVIDER_SITE_OTHER): Payer: Medicaid Other

## 2022-09-11 DIAGNOSIS — M654 Radial styloid tenosynovitis [de Quervain]: Secondary | ICD-10-CM

## 2022-09-11 DIAGNOSIS — M25531 Pain in right wrist: Secondary | ICD-10-CM

## 2022-09-11 NOTE — Progress Notes (Signed)
Mercede Litzy Casaletto - 27 y.o. female MRN [id number]  Date of birth: 1995/08/02  Office Visit Note: Visit Date: 09/11/2022 PCP: Pcp, No Referred by: Milas Hock, MD  Subjective: Chief Complaint  Patient presents with   Right Wrist - Pain   HPI: Vincenta Jayda Gildon is a pleasant 27 y.o. female who presents today for ongoing pain in the radial aspect of her right wrist.  She had a recent baby approximately 8 months ago, started develop pain around that time.  Also thinks she may have developed increasing symptoms after an IV medication stick at the right wrist radial aspect as well.  Visit Reason:right wrist Hand dominance: right Occupation: unemployed Diabetic: No Heart/Lung History: none Blood Thinners:  none  Prior Testing/EMG: none Injections (Date): none Treatments: tylenol Prior Surgery:none   *pain in wrist began about 8 months ago after having her child; thinks it is from IV medication* Denies numbness/tingling Pain mostly when wrist is jostled   Pertinent ROS were reviewed with the patient and found to be negative unless otherwise specified above in HPI.   Assessment & Plan: Visit Diagnoses:  1. Pain in right wrist     Plan: Based upon clinical examination today, she does have pain in the radial aspect of the right wrist with a positive Lourena Simmonds suggesting ongoing de Quervain tenosynovitis.  There is also a component of positive Tinel's and hypersensitivity in the dorsal radial nerve sensory distribution which may have been aggravated from IV placement.  For the first extensor compartment tenosynovitis we discussed treatment allergies ranging from conservative to surgical.  From a conservative standpoint, we discussed anti-inflammatory medication in the form of Motrin versus Tylenol, Motrin 600 mg every 8 as needed, Tylenol 1000 mg every 8 as needed.  We also discussed topical medication in the form of Voltaren ointment as needed.  She would also be a  reasonable candidate for right wrist thumb spica brace in order to help mitigate symptoms.  We did discuss the possibility for cortisone injection to the right wrist first extensor compartment for symptom relief.  Also discussed the possibility for persistent symptoms despite conservative measures that may warrant for extensor compartment surgical release in the future.  For the time being, she would like to trial the thumb spica bracing as she has not utilized any bracing to date.  Brace was given today.  I will plan on seeing her back in approximate 6 weeks for a recheck, should her symptoms be persistent at that time we could consider cortisone injection to the right wrist first extensor compartment.  Follow-up: Return in about 6 weeks (around 10/23/2022).   Meds & Orders: No orders of the defined types were placed in this encounter.   Orders Placed This Encounter  Procedures   XR Wrist Complete Right     Procedures: No procedures performed      Clinical History: No specialty comments available.  She reports that she has never smoked. She has never used smokeless tobacco. No results for input(s): "HGBA1C", "LABURIC" in the last 8760 hours.  Objective:   Vital Signs: There were no vitals taken for this visit.  Physical Exam  Gen: Well-appearing, in no acute distress; non-toxic CV: Regular Rate. Well-perfused. Warm.  Resp: Breathing unlabored on room air; no wheezing. Psych: Fluid speech in conversation; appropriate affect; normal thought process Neuro: Sensation intact throughout. No gross coordination deficits.   Ortho Exam Right wrist: - Positive Finkelstein's - Wrist range of motion flexion 65, extension 55,  full composite fist without restriction - Mildly positive Tinel's over the DRSN distribution - Intact sensation in all distributions without significant deficit  Imaging: XR Wrist Complete Right  Result Date: 09/11/2022 X-rays of the right wrist, multiple views were  obtained today X-rays demonstrate stable appearance of the radiocarpal joint, well-preserved joint space seen in all planes.  Mild ulnar positivity without evidence of significant impaction.  Bone mineralization is appropriate, no significant bony abnormalities appreciated.   Past Medical/Family/Surgical/Social History: Medications & Allergies reviewed per EMR, new medications updated. There are no problems to display for this patient.  Past Medical History:  Diagnosis Date   GERD (gastroesophageal reflux disease)    Medical history non-contributory    Family History  Problem Relation Age of Onset   Diabetes Father    Cancer Maternal Uncle    Asthma Neg Hx    Heart disease Neg Hx    Hypertension Neg Hx    Stroke Neg Hx    Past Surgical History:  Procedure Laterality Date   CESAREAN SECTION N/A 12/04/2021   Procedure: CESAREAN SECTION;  Surgeon: Lazaro Arms, MD;  Location: [facility] LD ORS;  Service: Obstetrics;  Laterality: N/A;   NO PAST SURGERIES     WISDOM TOOTH EXTRACTION     Social History   Occupational History   Not on file  Tobacco Use   Smoking status: Never   Smokeless tobacco: Never  Vaping Use   Vaping status: Former  Substance and Sexual Activity   Alcohol use: Not Currently   Drug use: Not Currently   Sexual activity: Not Currently    Birth control/protection: Injection    Caliegh Middlekauff Fara Boros) Denese Killings, M.D. [facility] 3:14 PM

## 2022-09-16 ENCOUNTER — Other Ambulatory Visit: Payer: Self-pay

## 2022-09-16 ENCOUNTER — Emergency Department
Admission: EM | Admit: 2022-09-16 | Discharge: 2022-09-16 | Disposition: A | Discharge status: Home / Self Care | Payer: Medicaid Other | Attending: Emergency Medicine | Admitting: Emergency Medicine

## 2022-09-16 ENCOUNTER — Emergency Department: Payer: Medicaid Other

## 2022-09-16 DIAGNOSIS — R519 Headache, unspecified: Secondary | ICD-10-CM

## 2022-09-16 DIAGNOSIS — R059 Cough, unspecified: Secondary | ICD-10-CM | POA: Diagnosis not present

## 2022-09-16 DIAGNOSIS — Z20822 Contact with and (suspected) exposure to covid-19: Secondary | ICD-10-CM | POA: Diagnosis not present

## 2022-09-16 LAB — CBC WITH DIFFERENTIAL/PLATELET
Abs Immature Granulocytes: 0.03 10*3/uL (ref 0.00–0.07)
Basophils Absolute: 0 10*3/uL (ref 0.0–0.1)
Basophils Relative: 0 %
Eosinophils Absolute: 0 10*3/uL (ref 0.0–0.5)
Eosinophils Relative: 0 %
HCT: 39.9 % (ref 36.0–46.0)
Hemoglobin: 13.3 g/dL (ref 12.0–15.0)
Immature Granulocytes: 0 %
Lymphocytes Relative: 29 %
Lymphs Abs: 2.8 10*3/uL (ref 0.7–4.0)
MCH: 29.6 pg (ref 26.0–34.0)
MCHC: 33.3 g/dL (ref 30.0–36.0)
MCV: 88.9 fL (ref 80.0–100.0)
Monocytes Absolute: 0.6 10*3/uL (ref 0.1–1.0)
Monocytes Relative: 6 %
Neutro Abs: 6.4 10*3/uL (ref 1.7–7.7)
Neutrophils Relative %: 65 %
Platelets: 343 10*3/uL (ref 150–400)
RBC: 4.49 MIL/uL (ref 3.87–5.11)
RDW: 12 % (ref 11.5–15.5)
WBC: 9.8 10*3/uL (ref 4.0–10.5)
nRBC: 0 % (ref 0.0–0.2)

## 2022-09-16 LAB — COMPREHENSIVE METABOLIC PANEL
ALT: 17 U/L (ref 0–44)
AST: 16 U/L (ref 15–41)
Albumin: 4.2 g/dL (ref 3.5–5.0)
Alkaline Phosphatase: 89 U/L (ref 38–126)
Anion gap: 10 (ref 5–15)
BUN: 10 mg/dL (ref 6–20)
CO2: 25 mmol/L (ref 22–32)
Calcium: 9.3 mg/dL (ref 8.9–10.3)
Chloride: 103 mmol/L (ref 98–111)
Creatinine, Ser: 0.74 mg/dL (ref 0.44–1.00)
GFR, Estimated: >60 mL/min (ref >60)
Glucose, Bld: 86 mg/dL (ref 70–99)
Potassium: 3.8 mmol/L (ref 3.5–5.1)
Sodium: 138 mmol/L (ref 135–145)
Total Bilirubin: 0.4 mg/dL (ref 0.3–1.2)
Total Protein: 7.7 g/dL (ref 6.5–8.1)

## 2022-09-16 LAB — SARS CORONAVIRUS 2 BY RT PCR: SARS Coronavirus 2 by RT PCR: NEGATIVE

## 2022-09-16 MED ORDER — METOCLOPRAMIDE HCL 5 MG/ML IJ SOLN
10.0000 mg | Freq: Once | INTRAMUSCULAR | Status: AC
  Administered 2022-09-16: 10 mg via INTRAVENOUS
  Filled 2022-09-16: qty 2

## 2022-09-16 MED ORDER — DIPHENHYDRAMINE HCL 50 MG/ML IJ SOLN
50.0000 mg | Freq: Once | INTRAMUSCULAR | Status: AC
  Administered 2022-09-16: 50 mg via INTRAVENOUS
  Filled 2022-09-16: qty 1

## 2022-09-16 MED ORDER — SODIUM CHLORIDE 0.9 % IV BOLUS
1000.0000 mL | Freq: Once | INTRAVENOUS | Status: AC
  Administered 2022-09-16: 1000 mL via INTRAVENOUS

## 2022-09-16 NOTE — ED Triage Notes (Signed)
Pt to ed from home via POV for sharp headache on the left side around my left eye x 15 days. It is intermittent. It comes and goes. Not constant. Pt is caox4, in no acute distress and ambulatory in triage.

## 2022-09-16 NOTE — ED Provider Notes (Signed)
[facility] Provider Note  Patient Contact: 5:28 PM (approximate)   History   Headache   HPI  Roberta Hodges is a 27 y.o. female with an unremarkable past medical history, presents to the emergency department with a right-sided frontal headache.  Patient states that headache has occurred off and on for the past 2 weeks.  She states that usually her pain is a 1 or 2 out of 10 in intensity but today she felt like it was significantly more painful.  She denies associated vomiting or medication changes.  She states that she has had some cough and nasal congestion but denies fever.  No chest pain, chest tightness or abdominal pain.  No prior history of headaches.      Physical Exam   Triage Vital Signs: ED Triage Vitals  Encounter Vitals Group     BP 09/16/22 1553 136/79     Systolic BP Percentile --      Diastolic BP Percentile --      Pulse Rate 09/16/22 1553 90     Resp 09/16/22 1553 16     Temp 09/16/22 1553 98 F (36.7 C)     Temp Source 09/16/22 1553 Oral     SpO2 09/16/22 1553 100 %     Weight 09/16/22 1556 157 lb (71.2 kg)     Height 09/16/22 1556 5\' 1"  (1.549 m)     Head Circumference --      Peak Flow --      Pain Score 09/16/22 1556 5     Pain Loc --      Pain Education --      Exclude from Growth Chart --     Most recent vital signs: Vitals:   09/16/22 1553 09/16/22 1913  BP: 136/79 122/71  Pulse: 90 79  Resp: 16 16  Temp: 98 F (36.7 C) 98.1 F (36.7 C)  SpO2: 100% 100%     General: Alert and in no acute distress. Eyes:  PERRL. EOMI. Head: No acute traumatic findings ENT:      Nose: No congestion/rhinnorhea.      Mouth/Throat: Mucous membranes are moist. Neck: No stridor. No cervical spine tenderness to palpation. Cardiovascular:  Good peripheral perfusion Respiratory: Normal respiratory effort without tachypnea or retractions. Lungs CTAB. Good air entry to the bases with no decreased or absent breath  sounds. Gastrointestinal: Bowel sounds 4 quadrants. Soft and nontender to palpation. No guarding or rigidity. No palpable masses. No distention. No CVA tenderness. Musculoskeletal: Patient has symmetric strength in the upper and lower extremities.  Full range of motion to all extremities.  Neurologic:  No gross focal neurologic deficits are appreciated.  Skin:   No rash noted    ED Results / Procedures / Treatments   Labs (all labs ordered are listed, but only abnormal results are displayed) Labs Reviewed  SARS CORONAVIRUS 2 BY RT PCR  CBC WITH DIFFERENTIAL/PLATELET  COMPREHENSIVE METABOLIC PANEL        RADIOLOGY  I personally viewed and evaluated these images as part of my medical decision making, as well as reviewing the written report by the radiologist.  ED Provider Interpretation: CT head reassuring without acute abnormality.   PROCEDURES:  Critical Care performed: No  Procedures   MEDICATIONS ORDERED IN ED: Medications  metoCLOPramide (REGLAN) injection 10 mg (10 mg Intravenous Given 09/16/22 1808)  diphenhydrAMINE (BENADRYL) injection 50 mg (50 mg Intravenous Given 09/16/22 1808)  sodium chloride 0.9 % bolus 1,000 mL (0 mLs  Intravenous Stopped 09/16/22 1914)     IMPRESSION / MDM / ASSESSMENT AND PLAN / ED COURSE  I reviewed the triage vital signs and the nursing notes.                              Assessment and plan: Headache:  27 year old female presents to the emergency department with headache as described above.  Vital signs are reassuring at triage.  On exam, patient was alert, active and nontoxic-appearing with no neurodeficits noted.    CT head was unremarkable. CBC and CMP reassuring.  COVID-19 negative.  Patient received migraine cocktail with Reglan, Benadryl and supplemental fluids and she felt significantly improved.  Patient requested discharge.  Return precautions were given to return with new or worsening symptoms.  All patient questions  were answered.  FINAL CLINICAL IMPRESSION(S) / ED DIAGNOSES   Final diagnoses:  Acute nonintractable headache, unspecified headache type     Rx / DC Orders   ED Discharge Orders     None        Note:  This document was prepared using Dragon voice recognition software and may include unintentional dictation errors.   Pia Mau [place], Cordelia Poche 09/16/22 2307    Dionne Bucy, MD 09/17/22 [phone]

## 2022-09-18 ENCOUNTER — Encounter: Payer: Self-pay | Admitting: Obstetrics and Gynecology

## 2022-09-28 ENCOUNTER — Ambulatory Visit: Payer: Medicaid Other

## 2022-10-30 ENCOUNTER — Ambulatory Visit: Payer: Medicaid Other | Admitting: Orthopedic Surgery

## 2022-11-14 ENCOUNTER — Ambulatory Visit (INDEPENDENT_AMBULATORY_CARE_PROVIDER_SITE_OTHER): Payer: Medicaid Other | Admitting: Obstetrics and Gynecology

## 2022-11-14 ENCOUNTER — Encounter: Payer: Self-pay | Admitting: Obstetrics and Gynecology

## 2022-11-14 ENCOUNTER — Other Ambulatory Visit: Payer: Self-pay

## 2022-11-14 VITALS — BP 108/75 | HR 105 | Ht 59.06 in | Wt 160.0 lb

## 2022-11-14 DIAGNOSIS — Z30430 Encounter for insertion of intrauterine contraceptive device: Secondary | ICD-10-CM

## 2022-11-14 DIAGNOSIS — Z1330 Encounter for screening examination for mental health and behavioral disorders, unspecified: Secondary | ICD-10-CM

## 2022-11-14 LAB — POCT PREGNANCY, URINE: Preg Test, Ur: NEGATIVE

## 2022-11-14 MED ORDER — PARAGARD INTRAUTERINE COPPER IU IUD
1.0000 | INTRAUTERINE_SYSTEM | Freq: Once | INTRAUTERINE | Status: AC
  Administered 2022-11-14: 1 via INTRAUTERINE

## 2022-11-14 NOTE — Patient Instructions (Signed)

## 2022-11-14 NOTE — Progress Notes (Signed)
   GYNECOLOGY CLINIC PROCEDURE NOTE  Roberta Hodges is a 27 y.o. G1P1001 here for Copper IUD insertion. No GYN concerns.  Last pap smear was on 07/14/21 and was normal.  IUD Insertion Procedure Note Patient identified, informed consent performed, consent signed.   Discussed risks of irregular bleeding, cramping, infection, malpositioning or misplacement of the IUD outside the uterus which may require further procedure such as laparoscopy. Time out was performed.  Urine pregnancy test negative.  Speculum placed in the vagina.  Cervix visualized.  Cleaned with Betadine x 2.  Grasped anteriorly with a single tooth tenaculum.  Uterus sounded to 7 cm.  Liletta IUD placed per manufacturer's recommendations.  Strings trimmed to 3 cm. Tenaculum was removed, good hemostasis noted.  Patient tolerated procedure well.   Patient was given post-procedure instructions.  She was advised to have backup contraception for one week.  Patient was also asked to check IUD strings periodically and follow up in 4 weeks for IUD check.

## 2022-11-17 ENCOUNTER — Encounter: Payer: Self-pay | Admitting: Family Medicine

## 2022-11-17 ENCOUNTER — Ambulatory Visit: Payer: Medicaid Other | Admitting: Family Medicine

## 2022-11-17 VITALS — BP 129/80 | HR 98 | Wt 160.4 lb

## 2022-11-17 DIAGNOSIS — Z30433 Encounter for removal and reinsertion of intrauterine contraceptive device: Secondary | ICD-10-CM | POA: Diagnosis not present

## 2022-11-17 DIAGNOSIS — T8332XA Displacement of intrauterine contraceptive device, initial encounter: Secondary | ICD-10-CM | POA: Diagnosis not present

## 2022-11-17 DIAGNOSIS — T839XXA Unspecified complication of genitourinary prosthetic device, implant and graft, initial encounter: Secondary | ICD-10-CM | POA: Insufficient documentation

## 2022-11-17 DIAGNOSIS — Z975 Presence of (intrauterine) contraceptive device: Secondary | ICD-10-CM | POA: Insufficient documentation

## 2022-11-17 MED ORDER — PARAGARD INTRAUTERINE COPPER IU IUD
1.0000 | INTRAUTERINE_SYSTEM | Freq: Once | INTRAUTERINE | Status: AC
  Administered 2022-11-17: 1 via INTRAUTERINE

## 2022-11-17 NOTE — Progress Notes (Signed)
    GYNECOLOGY OFFICE PROCEDURE NOTE  Roberta Hodges is a 28 y.o. G1P1001 here for IUD check up after insertion three days prior. Last pap smear was on 07/14/2021 and was normal.  On exam inferior portion of Paragard IUD was visualized at the cervical os and removal was recommended. Patient requested repeat attempt at placement of Paragard.  IUD Removal  Patient identified, informed consent performed, consent signed.  Patient was in the dorsal lithotomy position, normal external genitalia was noted.  A speculum was placed in the patient's vagina, normal discharge was noted, no lesions. The cervix was visualized, no lesions, no abnormal discharge.  The strings of the IUD were grasped and pulled using ring forceps. The IUD was removed in its entirety.   Patient tolerated the procedure well.       GYNECOLOGY OFFICE PROCEDURE NOTE  Roberta Hodges is a 27 y.o. G1P1001 here for Paragard IUD insertion. No GYN concerns.  Last pap smear:  Lab Results  Component Value Date   DIAGPAP  07/14/2021    - Negative for intraepithelial lesion or malignancy (NILM)   HPVHIGH Negative 07/14/2021    Urine pregnancy test: n/a, see above clinical history  IUD Insertion Procedure Note Patient identified, informed consent performed, consent signed.   Discussed risks of irregular bleeding, heavier/longer periods, increased cramping, infection, malpositioning or misplacement of the IUD outside the uterus which may require further procedure such as laparoscopy. Also discussed >99% contraception efficacy, increased risk of ectopic pregnancy with failure of method.  Time out was performed.  Speculum placed in the vagina.  Cervix visualized.  Cleaned with Betadine x 2. Uterus sounded to 9 cm. IUD placed per manufacturer's recommendations.  Strings trimmed to 3 cm. Tenaculum was removed, good hemostasis noted.  Patient tolerated procedure well.   Patient was given post-procedure instructions.  She was  advised to have backup contraception for one week.  Patient was also asked to check IUD strings periodically and follow up in 4 weeks for IUD check.  Venora Maples, MD/MPH Attending Family Medicine Physician, [facility] for [facility], [facility]

## 2022-11-17 NOTE — Addendum Note (Signed)
Addended by: Isabell Jarvis on: 11/17/2022 12:33 PM   Modules accepted: Orders

## 2022-12-14 ENCOUNTER — Ambulatory Visit: Payer: Medicaid Other | Admitting: Certified Nurse Midwife

## 2022-12-14 DIAGNOSIS — Z603 Acculturation difficulty: Secondary | ICD-10-CM

## 2022-12-14 DIAGNOSIS — Z3009 Encounter for other general counseling and advice on contraception: Secondary | ICD-10-CM

## 2022-12-14 DIAGNOSIS — Z975 Presence of (intrauterine) contraceptive device: Secondary | ICD-10-CM

## 2023-01-29 ENCOUNTER — Ambulatory Visit: Payer: Medicaid Other | Admitting: Medical

## 2023-02-09 ENCOUNTER — Ambulatory Visit (INDEPENDENT_AMBULATORY_CARE_PROVIDER_SITE_OTHER): Payer: Medicaid Other | Admitting: Obstetrics & Gynecology

## 2023-02-09 ENCOUNTER — Other Ambulatory Visit: Payer: Self-pay

## 2023-02-09 ENCOUNTER — Encounter: Payer: Self-pay | Admitting: Obstetrics & Gynecology

## 2023-02-09 ENCOUNTER — Other Ambulatory Visit (HOSPITAL_COMMUNITY)
Admission: RE | Admit: 2023-02-09 | Discharge: 2023-02-09 | Disposition: A | Discharge status: Home / Self Care | Payer: Medicaid Other | Source: Ambulatory Visit | Attending: Obstetrics & Gynecology | Admitting: Obstetrics & Gynecology

## 2023-02-09 VITALS — BP 115/79 | HR 91 | Wt 155.3 lb

## 2023-02-09 DIAGNOSIS — Z975 Presence of (intrauterine) contraceptive device: Secondary | ICD-10-CM | POA: Diagnosis not present

## 2023-02-09 DIAGNOSIS — N898 Other specified noninflammatory disorders of vagina: Secondary | ICD-10-CM

## 2023-02-09 DIAGNOSIS — Z1331 Encounter for screening for depression: Secondary | ICD-10-CM

## 2023-02-09 NOTE — Progress Notes (Signed)
Patient ID: Roberta Hodges, female   DOB: [birth year], 28 y.o.   MRN: [id number]     HPI Roberta Hodges is a 28 y.o. female.  [id number] String check for ParaGard. Her partner has discomfort from the strings HPI  Past Medical History:  Diagnosis Date   GERD (gastroesophageal reflux disease)    Medical history non-contributory     Past Surgical History:  Procedure Laterality Date   CESAREAN SECTION N/A 12/04/2021   Procedure: CESAREAN SECTION;  Surgeon: Lazaro Arms, MD;  Location: [facility];  Service: Obstetrics;  Laterality: N/A;   NO PAST SURGERIES     WISDOM TOOTH EXTRACTION      Family History  Problem Relation Age of Onset   Diabetes Father    Cancer Maternal Uncle    Asthma Neg Hx    Heart disease Neg Hx    Hypertension Neg Hx    Stroke Neg Hx     Social History Social History   Tobacco Use   Smoking status: Never   Smokeless tobacco: Never  Vaping Use   Vaping status: Former  Substance Use Topics   Alcohol use: Not Currently   Drug use: Not Currently    No Known Allergies  Current Outpatient Medications  Medication Sig Dispense Refill   ferrous sulfate (FER-IN-SOL) 75 (15 Fe) MG/ML SOLN Take by mouth.     omega-3 acid ethyl esters (LOVAZA) 1 g capsule Take by mouth 2 (two) times daily.     vitamin B-12 (CYANOCOBALAMIN) 100 MCG tablet Take 100 mcg by mouth daily.     Prenatal Vit-Fe Fumarate-FA (PRENATAL PO) Take by mouth. (Patient not taking: Reported on 02/09/2023)     No current facility-administered medications for this visit.    Review of Systems Review of Systems  Genitourinary:  Positive for menstrual problem (heavy with ParaGard) and vaginal bleeding. Negative for vaginal discharge.  All other systems reviewed and are negative.   Blood pressure 115/79, pulse 91, weight 155 lb 4.8 oz (70.4 kg), not currently breastfeeding.  Physical Exam Physical Exam Exam conducted with a chaperone present.  Constitutional:      Appearance:  Normal appearance.  Genitourinary:    General: Normal vulva.     Vagina: Vaginal discharge present.     Cervix: Normal.     Comments: String trimmed to 1-2 cm Neurological:     Mental Status: She is alert.  Psychiatric:        Mood and Affect: Mood normal.        Behavior: Behavior normal.     Data Reviewed   Assessment IUD (intrauterine device) in place  Vaginal irritation - Plan: Cervicovaginal ancillary only( [facility])   Plan F/u result If still having problems with IUD return to review    Scheryl Darter 02/09/2023, 11:03 AM

## 2023-02-12 LAB — CERVICOVAGINAL ANCILLARY ONLY
Bacterial Vaginitis (gardnerella): POSITIVE — AB
Candida Glabrata: NEGATIVE
Candida Vaginitis: POSITIVE — AB
Chlamydia: NEGATIVE
Comment: NEGATIVE
Comment: NEGATIVE
Comment: NEGATIVE
Comment: NEGATIVE
Comment: NEGATIVE
Comment: NORMAL
Neisseria Gonorrhea: NEGATIVE
Trichomonas: NEGATIVE

## 2023-02-16 ENCOUNTER — Encounter: Payer: Self-pay | Admitting: Obstetrics & Gynecology

## 2023-02-16 MED ORDER — METRONIDAZOLE 500 MG PO TABS: 500.0000 mg | ORAL_TABLET | Freq: Two times a day (BID) | ORAL | 0 refills | Status: AC

## 2023-02-16 MED ORDER — FLUCONAZOLE 150 MG PO TABS: 150.0000 mg | ORAL_TABLET | Freq: Once | ORAL | 0 refills | Status: AC

## 2023-02-16 NOTE — Addendum Note (Signed)
Addended by: Adam Phenix on: 02/16/2023 09:45 AM   Modules accepted: Orders

## 2023-04-06 ENCOUNTER — Telehealth: Payer: Self-pay | Admitting: *Deleted

## 2023-04-06 NOTE — Telephone Encounter (Signed)
 Received a voicemail from a Ronaldo Miyamoto stating he is calling for [employer] on behalf of [employer] to verify patient had Paragard IUD inserted 11/14/22 and if there were any complications that resulted in return visit 11/17/22 for another paragard . Would like a call to confidential voicemail [phone], reference number [id number]. Nancy Fetter

## 2023-04-09 NOTE — Telephone Encounter (Signed)
 Left message that I am returning his call.  If she continues to have questions to please give the office a call back.    Leonette Nutting

## 2023-06-11 IMAGING — US US MFM OB DETAIL+14 WK
1 series · 14 of 28 positions shown · non-contrast
Comparison: none

[Series 1: us mfm ob detail+14 wk · 14 of 139 slices shown]
[im 6/139]
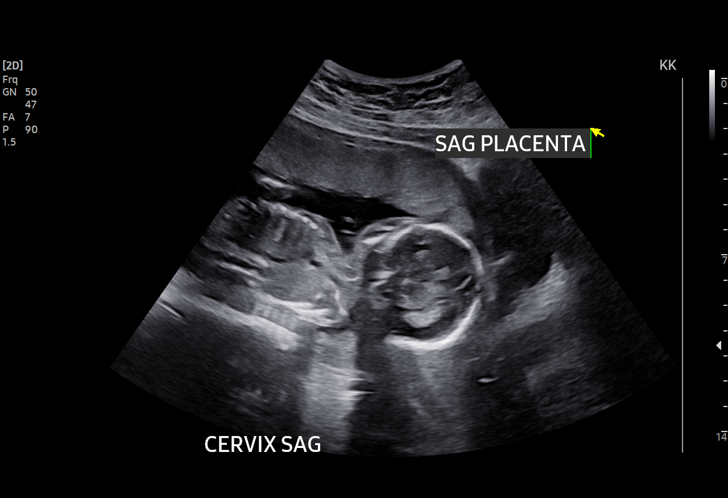
[im 16/139]
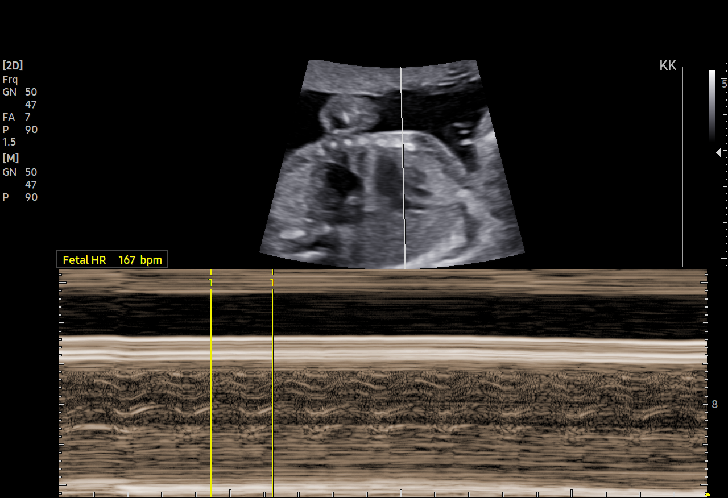
[im 26/139]
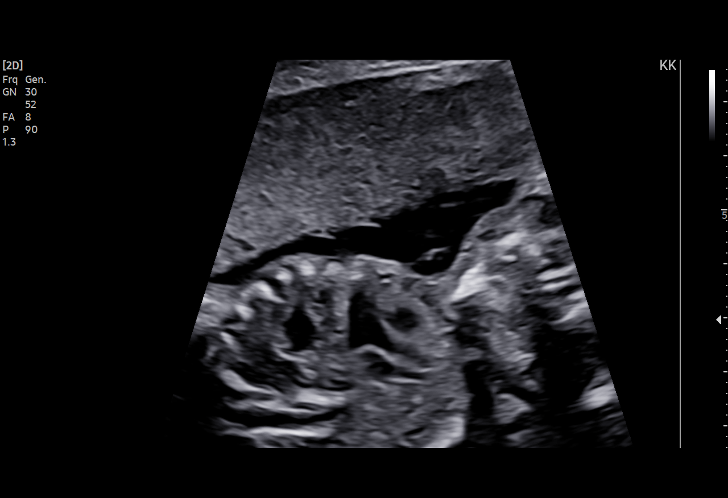
[im 36/139]
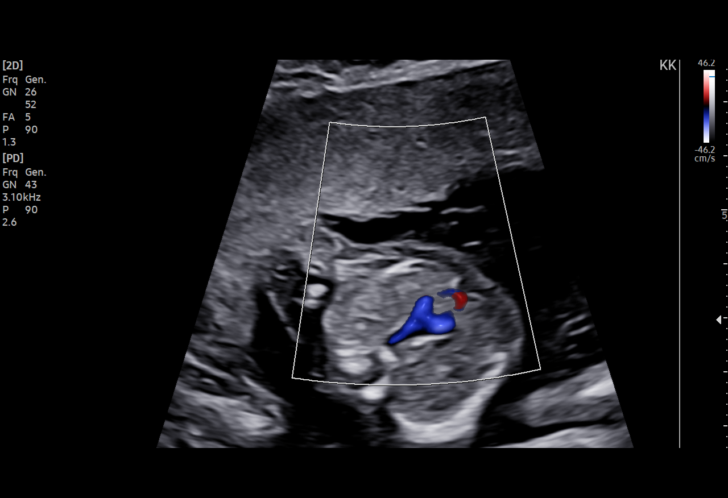
[im 47/139]
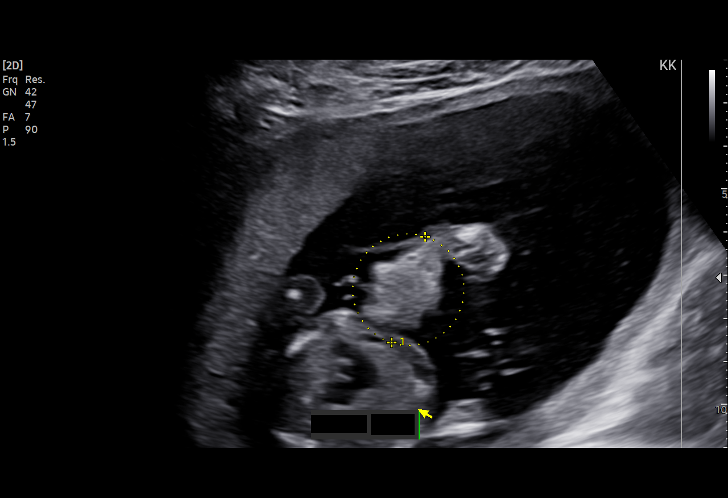
[im 57/139]
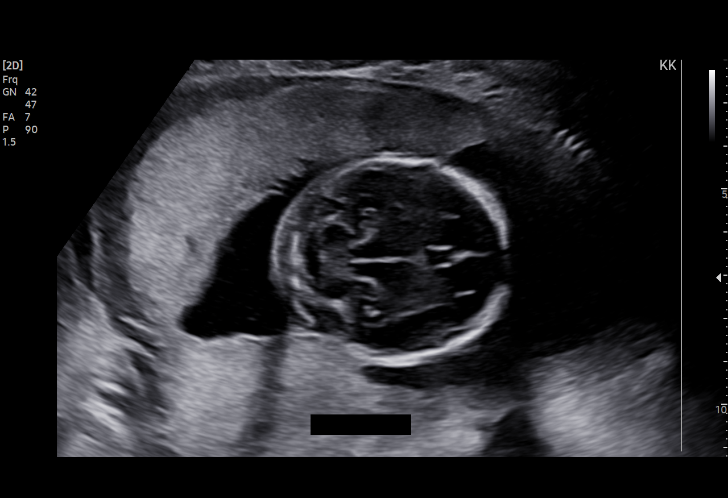
[im 67/139]
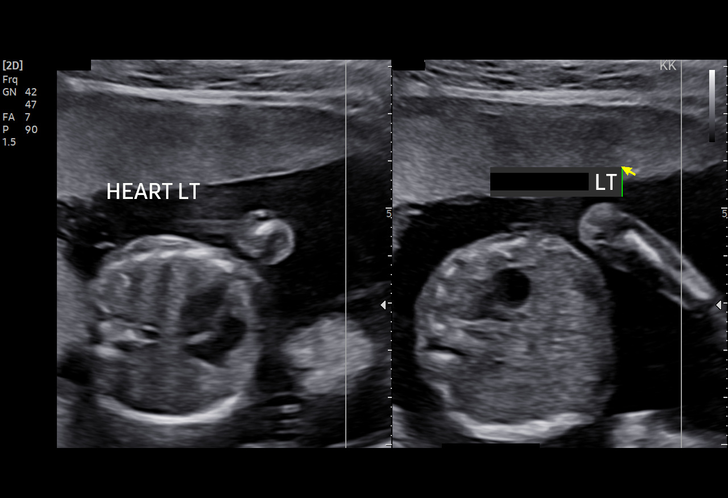
[im 77/139]
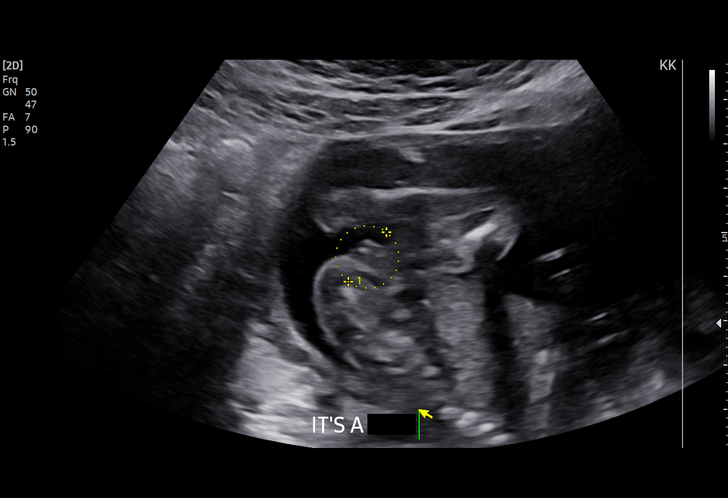
[im 87/139]
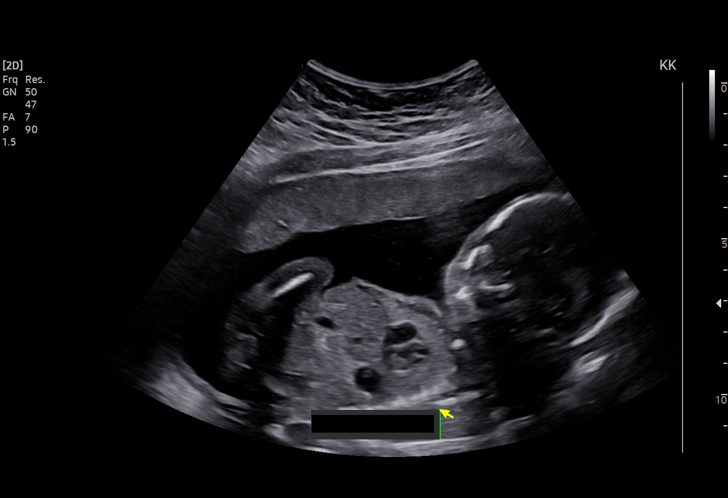
[im 98/139]
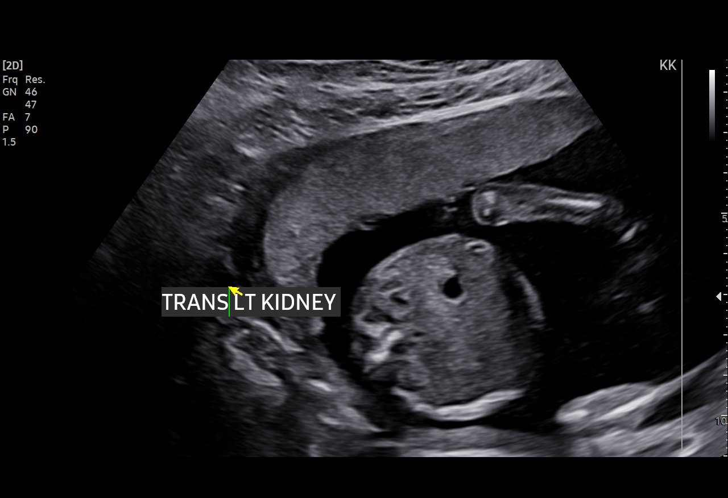
[im 108/139]
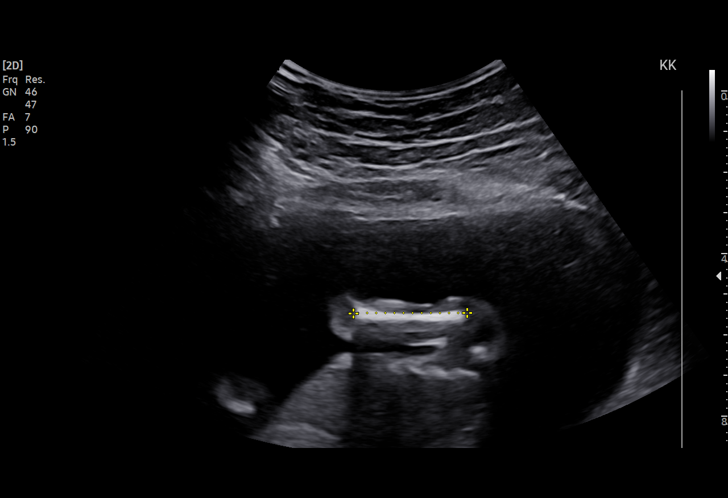
[im 118/139]
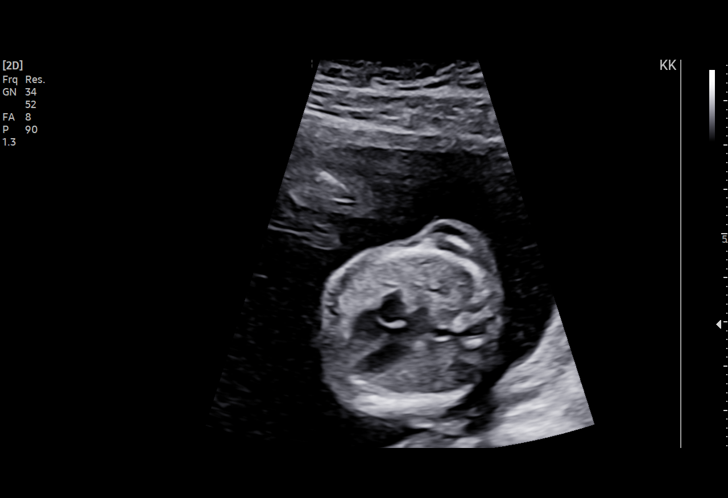
[im 128/139]
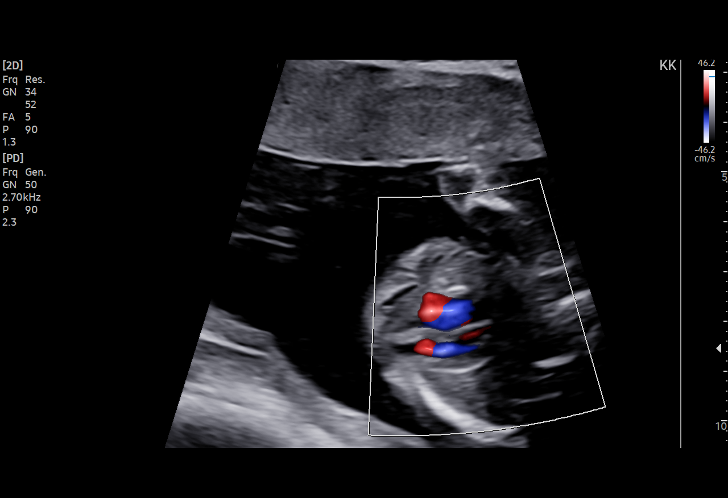
[im 139/139]
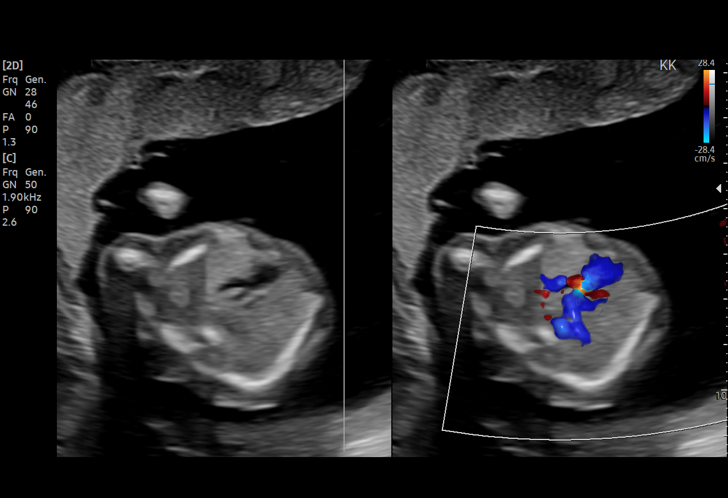

[14 of 28 positions shown; findings below may reference images not displayed]

RODRIGUEZ

Indications

 Chronic Hepatitis C complicating pregnancy,
 antepartum
 Encounter for antenatal screening for
 malformations
 Low Risk NIPS(Negative Horizon)(Negative
 AFP)
 19 weeks gestation of pregnancy
Vital Signs

 BMI:
Fetal Evaluation

 Num Of Fetuses:         1
 Fetal Heart Rate(bpm):  167
 Cardiac Activity:       Observed
 Presentation:           Cephalic
 Placenta:               Anterior
 P. Cord Insertion:      Visualized

 Amniotic Fluid
 AFI FV:      Within normal limits

                             Largest Pocket(cm)

Biometry
 BPD:     46.29  mm     G. Age:  20w 0d         79  %    CI:        78.77   %    70 - 86
                                                         FL/HC:      19.4   %    16.1 -
 HC:    164.94   mm     G. Age:  19w 1d         39  %    HC/AC:      1.14        1.09 -
 AC:    145.02   mm     G. Age:  19w 6d         64  %    FL/BPD:     69.2   %
 FL:      32.03  mm     G. Age:  20w 0d         68  %    FL/AC:      22.1   %    20 - 24
 HUM:      27.9  mm     G. Age:  19w 0d         42  %
 CER:      19.7  mm     G. Age:  19w 1d         41  %
 NFT:       4.1  mm

 LV:        6.1  mm
 CM:          4  mm

 Est. FW:     316  gm    0 lb 11 oz      77  %
OB History

 Gravidity:    1
Gestational Age

 LMP:           19w 2d        Date:  02/21/21                  EDD:   11/28/21
 U/S Today:     19w 5d                                        EDD:   11/25/21
 Best:          19w 2d     Det. By:  LMP  (02/21/21)          EDD:   11/28/21
Anatomy

 Cranium:               Appears normal         Aortic Arch:            Appears normal
 Cavum:                 Appears normal         Ductal Arch:            Appears normal
 Ventricles:            Appears normal         Diaphragm:              Appears normal
 Choroid Plexus:        Appears normal         Stomach:                Appears normal, left
                                                                       sided
 Cerebellum:            Appears normal         Abdomen:                Appears normal
 Posterior Fossa:       Appears normal         Abdominal Wall:         Appears nml (cord
                                                                       insert, abd wall)
 Nuchal Fold:           Appears normal         Cord Vessels:           Appears normal (3
                                                                       vessel cord)
 Face:                  Appears normal         Kidneys:                Appear normal
                        (orbits and profile)
 Lips:                  Appears normal         Bladder:                Appears normal
 Thoracic:              Appears normal         Spine:                  Not well visualized
 Heart:                 Appears normal         Upper Extremities:      Appears normal
                        (4CH, axis, and
                        situs)
 RVOT:                  Appears normal         Lower Extremities:      Appears normal

 Other:  Fetus appears to be female. Heels visualized. Technically difficult due
         to fetal position.
Cervix Uterus Adnexa

 Cervix
 Length:            3.7  cm.
 Normal appearance by transabdominal scan.

 Adnexa
 No abnormality visualized.
Impression
 Single intrauterine pregnancy here for a detailed anatomy
 due to hepatitis C
 Normal anatomy with measurements consistent with dates
 There is good fetal movement and amniotic fluid volume
 Suboptimal views of the fetal anatomy were obtained
 secondary to fetal position.
Recommendations

 Follow up growth in 4-6 weeks.
